# Patient Record
Sex: Female | Born: 1960 | Race: Black or African American | Hispanic: No | Smoking: Former smoker
Health system: Southern US, Community
[De-identification: ages and names within clinical notes are randomized; demographics above are authoritative.]

## PROBLEM LIST (undated history)

## (undated) DIAGNOSIS — T7840XA Allergy, unspecified, initial encounter: Secondary | ICD-10-CM

## (undated) DIAGNOSIS — K635 Polyp of colon: Secondary | ICD-10-CM

## (undated) HISTORY — PX: MANDIBLE SURGERY: SHX707

## (undated) HISTORY — DX: Allergy, unspecified, initial encounter: T78.40XA

## (undated) HISTORY — DX: Polyp of colon: K63.5

---

## 2007-04-07 ENCOUNTER — Encounter: Admission: RE | Admit: 2007-04-07 | Discharge: 2007-04-07 | Payer: Self-pay | Admitting: Family Medicine

## 2007-04-18 ENCOUNTER — Encounter: Admission: RE | Admit: 2007-04-18 | Discharge: 2007-04-18 | Payer: Self-pay | Admitting: Family Medicine

## 2008-04-19 ENCOUNTER — Encounter: Admission: RE | Admit: 2008-04-19 | Discharge: 2008-04-19 | Payer: Self-pay | Admitting: Internal Medicine

## 2009-11-29 ENCOUNTER — Encounter: Admission: RE | Admit: 2009-11-29 | Discharge: 2009-11-29 | Payer: Self-pay | Admitting: Internal Medicine

## 2010-06-08 DIAGNOSIS — K635 Polyp of colon: Secondary | ICD-10-CM

## 2010-06-08 HISTORY — DX: Polyp of colon: K63.5

## 2010-06-29 ENCOUNTER — Encounter: Payer: Self-pay | Admitting: Family Medicine

## 2010-12-26 ENCOUNTER — Other Ambulatory Visit: Payer: Self-pay | Admitting: Internal Medicine

## 2010-12-26 DIAGNOSIS — Z1231 Encounter for screening mammogram for malignant neoplasm of breast: Secondary | ICD-10-CM

## 2011-01-01 ENCOUNTER — Ambulatory Visit
Admission: RE | Admit: 2011-01-01 | Discharge: 2011-01-01 | Disposition: A | Discharge status: Home / Self Care | Payer: Managed Care, Other (non HMO) | Source: Ambulatory Visit | Attending: Internal Medicine | Admitting: Internal Medicine

## 2011-01-01 DIAGNOSIS — Z1231 Encounter for screening mammogram for malignant neoplasm of breast: Secondary | ICD-10-CM

## 2011-02-19 ENCOUNTER — Other Ambulatory Visit (HOSPITAL_COMMUNITY): Payer: Self-pay | Admitting: Gastroenterology

## 2011-02-19 DIAGNOSIS — R112 Nausea with vomiting, unspecified: Secondary | ICD-10-CM

## 2011-03-13 ENCOUNTER — Ambulatory Visit (HOSPITAL_COMMUNITY)
Admission: RE | Admit: 2011-03-13 | Discharge: 2011-03-13 | Disposition: A | Discharge status: Home / Self Care | Payer: Managed Care, Other (non HMO) | Source: Ambulatory Visit | Attending: Gastroenterology | Admitting: Gastroenterology

## 2011-03-13 ENCOUNTER — Encounter (HOSPITAL_COMMUNITY)
Admission: RE | Admit: 2011-03-13 | Discharge: 2011-03-13 | Disposition: A | Discharge status: Home / Self Care | Payer: Managed Care, Other (non HMO) | Source: Ambulatory Visit | Attending: Gastroenterology | Admitting: Gastroenterology

## 2011-03-13 DIAGNOSIS — R079 Chest pain, unspecified: Secondary | ICD-10-CM | POA: Insufficient documentation

## 2011-03-13 DIAGNOSIS — R112 Nausea with vomiting, unspecified: Secondary | ICD-10-CM | POA: Insufficient documentation

## 2011-03-13 DIAGNOSIS — R109 Unspecified abdominal pain: Secondary | ICD-10-CM | POA: Insufficient documentation

## 2011-03-13 MED ORDER — SINCALIDE 5 MCG IJ SOLR: 0.0200 ug/kg | Freq: Once | INTRAMUSCULAR | Status: DC

## 2011-03-13 MED ORDER — TECHNETIUM TC 99M MEBROFENIN IV KIT
5.4000 | PACK | Freq: Once | INTRAVENOUS | Status: AC | PRN
  Administered 2011-03-13: 5 via INTRAVENOUS

## 2011-04-24 HISTORY — PX: COLONOSCOPY W/ POLYPECTOMY: SHX1380

## 2011-06-03 ENCOUNTER — Ambulatory Visit (INDEPENDENT_AMBULATORY_CARE_PROVIDER_SITE_OTHER): Payer: Managed Care, Other (non HMO)

## 2011-06-03 DIAGNOSIS — Z23 Encounter for immunization: Secondary | ICD-10-CM

## 2011-06-03 DIAGNOSIS — J309 Allergic rhinitis, unspecified: Secondary | ICD-10-CM

## 2011-06-15 ENCOUNTER — Encounter: Payer: Self-pay | Admitting: Internal Medicine

## 2011-06-15 DIAGNOSIS — J309 Allergic rhinitis, unspecified: Secondary | ICD-10-CM | POA: Insufficient documentation

## 2011-12-16 ENCOUNTER — Other Ambulatory Visit: Payer: Self-pay | Admitting: Internal Medicine

## 2011-12-16 ENCOUNTER — Ambulatory Visit (INDEPENDENT_AMBULATORY_CARE_PROVIDER_SITE_OTHER): Payer: 59 | Admitting: Family Medicine

## 2011-12-16 VITALS — BP 122/66 | HR 88 | Temp 98.3°F | Resp 16 | Ht 63.0 in | Wt 234.2 lb

## 2011-12-16 DIAGNOSIS — M25579 Pain in unspecified ankle and joints of unspecified foot: Secondary | ICD-10-CM

## 2011-12-16 NOTE — Patient Instructions (Addendum)
You have 2 issues going on:  1.  Ankle pain: - You should wear an ankle brace when you are running. - You should also do 30 ankle exercises (toe raises) on a step or phonebook 3 times a week. - Take 600 mg Ibuprofen (3 tabs) as needed.  2.  Leg weakness: - Use the exercises below.   - These will strengthen the smaller muscles as well as the larger ones you are currently working out    = STRENGTHENING EXERCISES - Piriformis Syndrome  These are some of the caregiver again or until your symptoms are resolved. Remember:   Strong muscles with good endurance tolerate stress better.   Do the exercises as initially prescribed by your caregiver. Progress slowly with each exercise, gradually increasing the number of repetitions and weight used under their guidance.  STRENGTH - Hip Abductors, Straight Leg Raises Be aware of your form throughout the entire exercise so that you exercise the correct muscles. Sloppy form means that you are not strengthening the correct muscles.  Lie on your side so that your head, shoulders, knee and hip line up. You may bend your lower knee to help maintain your balance. Your right / left leg should be on top.   Roll your hips slightly forward, so that your hips are stacked directly over each other and your right / left knee is facing forward.   Lift your top leg up 4-6 inches, leading with your heel. Be sure that your foot does not drift forward or that your knee does not roll toward the ceiling.   Hold this position for __________ seconds. You should feel the muscles in your outer hip lifting (you may not notice this until your leg begins to tire).   Slowly lower your leg to the starting position. Allow the muscles to fully relax before beginning the next repetition.  Repeat __________ times. Complete this exercise __________ times per day.  STRENGTH - Hip Abductors, Quadriped  On a firm, lightly padded surface, position yourself on your hands and knees. Your  hands should be directly below your shoulders and your knees should be directly below your hips.   Keeping your right / left knee bent, lift your leg out to the side. Keep your legs level and in line with your shoulders.   Position yourself on your hands and knees.   Hold for __________ seconds.   Keeping your trunk steady and your hips level, slowly lower your leg to the starting position.  Repeat __________ times. Complete this exercise __________ times per day.  STRENGTH - Hip Abductors, Standing  Tie one end of a rubber exercise band/tubing to a secure surface (table, pole) and tie a loop at the other end.   Place the loop around your right / left ankle. Keeping your ankle with the band directly opposite of the secured end, step away until there is tension in the tube/band.   Hold onto a chair as needed for balance.   Keeping your back upright, your shoulders over your hips, and your toes pointing forward, lift your right / left leg out to your side. Be sure to lift your leg with your hip muscles. Do not "throw" your leg or tip your body to lift your leg.   Slowly and with control, return to the starting position.  Repeat exercise __________ times. Complete this exercise __________ times per day.  Document Released: 05/25/2005 Document Revised: 05/14/2011 Document Reviewed: 09/06/2008 [facility] Patient Information 2012 [place], [place].

## 2011-12-16 NOTE — Progress Notes (Signed)
Patient ID: Kellie Santiago, female   DOB: 07-Dec-1960, 51 y.o.   MRN: [id number] Kellie Santiago is a 52 y.o. female who presents to Urgent Care today for Left ankle pain, chronic in nautre:  1.  Left ankle pain:  Present since she had bad ankle sprain about 5 years ago.  Dull aching, intermittent swelling in Left ankle.  Has also noticed some decreased strength in Left leg.  Has started working out to lose weight and wants to ensure she won't injure it worse.    Has occasional pain when she tries running.  Has brace at home but doesn't wear it.  She is able to do squats and other exercises without pain.  Has tried Ibuprofen with occasional relief.     PMH reviewed.  ROS as above otherwise neg.  No chest pain, palpitations, SOB, Fever, Chills, Abd pain, N/V/D.  Medications reviewed. Current Outpatient Prescriptions  Medication Sig Dispense Refill  . fluticasone (FLONASE) 50 MCG/ACT nasal spray Place 2 sprays into the nose daily.        Marland Kitchen levocetirizine (XYZAL) 5 MG tablet Take 5 mg by mouth every evening.          Exam:  BP 122/66  Pulse 88  Temp 98.3 F (36.8 C) (Oral)  Resp 16  Ht 5\' 3"  (1.6 m)  Wt 234 lb 3.2 oz (106.232 kg)  BMI 41.49 kg/m2  SpO2 98%  LMP 11/23/2011 Gen: Well NAD MSK:  BL ankle joints with laxity noted.  Otherwise Right ankle WNL Left ankle:  No pain or tenderness with any manipulation of joint. Negative drawer test.  No laxity with eversion or inversion of ankle.  No syndesmotic pain.  Left hip flexor, hamstring strength 3/5.  Other LE strength 5/5 Exts: Trace edema BL ankles noted.    Assessment and Plan:  1.  Ankle pain:  Strengthening exercises, brace, NSAIDs.  See instructions.  No acute injury   2. Left leg weakness:  Provided exercises to help with gluteus medius strengthening.

## 2011-12-24 ENCOUNTER — Other Ambulatory Visit: Payer: Self-pay | Admitting: Family Medicine

## 2012-01-14 ENCOUNTER — Other Ambulatory Visit: Payer: Self-pay | Admitting: Internal Medicine

## 2012-01-14 DIAGNOSIS — Z1231 Encounter for screening mammogram for malignant neoplasm of breast: Secondary | ICD-10-CM

## 2012-01-25 ENCOUNTER — Ambulatory Visit (INDEPENDENT_AMBULATORY_CARE_PROVIDER_SITE_OTHER): Payer: 59 | Admitting: Physician Assistant

## 2012-01-25 VITALS — BP 136/79 | HR 68 | Temp 98.3°F | Resp 16 | Ht 63.18 in | Wt 232.6 lb

## 2012-01-25 DIAGNOSIS — Z0000 Encounter for general adult medical examination without abnormal findings: Secondary | ICD-10-CM

## 2012-01-25 LAB — POCT CBC
Granulocyte percent: 60.2 %G (ref 37–80)
HCT, POC: 42 % (ref 37.7–47.9)
Hemoglobin: 13.2 g/dL (ref 12.2–16.2)
Lymph, poc: 3.1 (ref 0.6–3.4)
MCH, POC: 26 pg — AB (ref 27–31.2)
MCHC: 31.4 g/dL — AB (ref 31.8–35.4)
MCV: 82.7 fL (ref 80–97)
MID (cbc): 0.6 (ref 0–0.9)
MPV: 9.3 fL (ref 0–99.8)
POC Granulocyte: 5.7 (ref 2–6.9)
POC LYMPH PERCENT: 33 %L (ref 10–50)
POC MID %: 6.8 %M (ref 0–12)
Platelet Count, POC: 314 10*3/uL (ref 142–424)
RBC: 5.08 M/uL (ref 4.04–5.48)
RDW, POC: 13.9 %
WBC: 9.5 10*3/uL (ref 4.6–10.2)

## 2012-01-25 LAB — POCT UA - MICROSCOPIC ONLY
Casts, Ur, LPF, POC: NEGATIVE
Crystals, Ur, HPF, POC: NEGATIVE
Mucus, UA: NEGATIVE
Yeast, UA: NEGATIVE

## 2012-01-25 LAB — POCT URINALYSIS DIPSTICK
Bilirubin, UA: NEGATIVE
Glucose, UA: NEGATIVE
Ketones, UA: NEGATIVE
Leukocytes, UA: NEGATIVE
Nitrite, UA: NEGATIVE
Protein, UA: NEGATIVE
Spec Grav, UA: 1.02
Urobilinogen, UA: 0.2
pH, UA: 5.5

## 2012-01-25 MED ORDER — NORETHIN-ETH ESTRAD TRIPHASIC 0.5/0.75/1-35 MG-MCG PO TABS: 1.0000 | ORAL_TABLET | Freq: Every day | ORAL | Status: DC

## 2012-01-25 NOTE — Progress Notes (Signed)
  Subjective:    Patient ID: Kellie Santiago, female    DOB: Nov 26, 1960, 51 y.o.   MRN: [id number]  HPI 51 year old female presents for complete physical.  Last physical August 2012 that included a pap test which was normal.  She has no specific complaints today and says she is doing well. Has started exercising and working on weight loss. She is doing weights as well as aerobic exercise 3-4 times/week.  She has lost 2 pounds since last OV in July.  Continues to take OCP's. Periods regular while on them. She tried to discontinue last year but had irregular periods so she re-started.  No history of hypertension, diabetes, or hyperlipidemia. Patient is a nonsmoker except a cigar on her birthday.     Review of Systems  All other systems reviewed and are negative.       Objective:   Physical Exam  Constitutional: She is oriented to person, place, and time. She appears well-developed and well-nourished.  HENT:  Head: Normocephalic and atraumatic.  Right Ear: Hearing, tympanic membrane, external ear and ear canal normal.  Left Ear: Hearing, tympanic membrane, external ear and ear canal normal.  Mouth/Throat: Uvula is midline, oropharynx is clear and moist and mucous membranes are normal. No oropharyngeal exudate.  Eyes: Conjunctivae and EOM are normal. Pupils are equal, round, and reactive to light.  Neck: Normal range of motion. Neck supple. No thyromegaly present.  Cardiovascular: Normal rate, regular rhythm and normal heart sounds.   Pulmonary/Chest: Effort normal and breath sounds normal.  Abdominal: Soft. Bowel sounds are normal. There is no tenderness.  Genitourinary: No breast swelling, tenderness, discharge or bleeding.  Lymphadenopathy:    She has no cervical adenopathy.  Neurological: She is alert and oriented to person, place, and time.  Psychiatric: She has a normal mood and affect. Her behavior is normal. Judgment and thought content normal.          Assessment & Plan:   1.  Routine general medical examination at a health care facility  POCT CBC, POCT urinalysis dipstick, TSH, Lipid panel, Comprehensive metabolic panel, POCT UA - Microscopic Only  Patient has mammogram scheduled for 01/29/12.  Had colonoscopy November 2012 - repeat in 5 years.  Recommend next screening pap in 2015.  Follow up as needed.

## 2012-01-26 LAB — LIPID PANEL
Cholesterol: 228 mg/dL — ABNORMAL HIGH (ref 0–200)
HDL: 62 mg/dL (ref >39)
LDL Cholesterol: 121 mg/dL — ABNORMAL HIGH (ref 0–99)
Total CHOL/HDL Ratio: 3.7 Ratio
Triglycerides: 227 mg/dL — ABNORMAL HIGH (ref <150)
VLDL: 45 mg/dL — ABNORMAL HIGH (ref 0–40)

## 2012-01-26 LAB — COMPREHENSIVE METABOLIC PANEL
ALT: 44 U/L — ABNORMAL HIGH (ref 0–35)
AST: 41 U/L — ABNORMAL HIGH (ref 0–37)
Albumin: 4.5 g/dL (ref 3.5–5.2)
Alkaline Phosphatase: 65 U/L (ref 39–117)
BUN: 15 mg/dL (ref 6–23)
CO2: 27 mEq/L (ref 19–32)
Calcium: 9.8 mg/dL (ref 8.4–10.5)
Chloride: 101 mEq/L (ref 96–112)
Creat: 0.92 mg/dL (ref 0.50–1.10)
Glucose, Bld: 87 mg/dL (ref 70–99)
Potassium: 3.9 mEq/L (ref 3.5–5.3)
Sodium: 138 mEq/L (ref 135–145)
Total Bilirubin: 0.4 mg/dL (ref 0.3–1.2)
Total Protein: 7.6 g/dL (ref 6.0–8.3)

## 2012-01-26 LAB — TSH: TSH: 1.889 u[IU]/mL (ref 0.350–4.500)

## 2012-01-29 ENCOUNTER — Ambulatory Visit
Admission: RE | Admit: 2012-01-29 | Discharge: 2012-01-29 | Disposition: A | Discharge status: Home / Self Care | Payer: 59 | Source: Ambulatory Visit | Attending: Internal Medicine | Admitting: Internal Medicine

## 2012-01-29 DIAGNOSIS — Z1231 Encounter for screening mammogram for malignant neoplasm of breast: Secondary | ICD-10-CM

## 2012-03-14 ENCOUNTER — Ambulatory Visit (INDEPENDENT_AMBULATORY_CARE_PROVIDER_SITE_OTHER): Payer: 59 | Admitting: Physician Assistant

## 2012-03-14 VITALS — BP 123/76 | HR 78 | Temp 97.9°F | Resp 18 | Ht 63.0 in | Wt 231.6 lb

## 2012-03-14 DIAGNOSIS — Z23 Encounter for immunization: Secondary | ICD-10-CM

## 2012-03-14 NOTE — Progress Notes (Signed)
Tdap only.  Vaccine authorized.

## 2012-03-14 NOTE — Progress Notes (Signed)
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  She is concerned about being a carrier of pertussis and not even know and did not want to pass it to the child

## 2012-07-04 ENCOUNTER — Ambulatory Visit (INDEPENDENT_AMBULATORY_CARE_PROVIDER_SITE_OTHER): Payer: 59 | Admitting: Family Medicine

## 2012-07-04 ENCOUNTER — Ambulatory Visit
Admission: RE | Admit: 2012-07-04 | Discharge: 2012-07-04 | Disposition: A | Discharge status: Home / Self Care | Payer: 59 | Source: Ambulatory Visit | Attending: Family Medicine | Admitting: Family Medicine

## 2012-07-04 VITALS — BP 126/84 | HR 83 | Temp 98.6°F | Resp 16 | Ht 63.0 in | Wt 228.0 lb

## 2012-07-04 DIAGNOSIS — R51 Headache: Secondary | ICD-10-CM

## 2012-07-04 DIAGNOSIS — S060X9A Concussion with loss of consciousness of unspecified duration, initial encounter: Secondary | ICD-10-CM

## 2012-07-04 DIAGNOSIS — R110 Nausea: Secondary | ICD-10-CM

## 2012-07-04 LAB — POCT URINE PREGNANCY: Preg Test, Ur: NEGATIVE

## 2012-07-04 MED ORDER — TRAMADOL HCL 50 MG PO TABS: 50.0000 mg | ORAL_TABLET | Freq: Three times a day (TID) | ORAL | Status: DC | PRN

## 2012-07-04 NOTE — Patient Instructions (Addendum)
You most likely have a concussion. Assuming that your CT scan is negative, we need you to rest and allow your brain to heal.  Let's plan to recheck on Wednesday.  Use the tramadol as needed for pain- you can also try ibuprofen/ tylenol.    Concussion and Brain Injury A blow or jolt to the head can disrupt the normal function of the brain. This type of brain injury is often called a "concussion" or a "closed head injury." Concussions are usually not life-threatening. Even so, the effects of a concussion can be serious.  CAUSES  A concussion is caused by a blunt blow to the head. The blow might be direct or indirect as described below.  Direct blow (running into another player during a soccer game, being hit in a fight, or hitting your head on a hard surface).  Indirect blow (when your head moves rapidly and violently back and forth like in a car crash). SYMPTOMS  The brain is very complex. Every head injury is different. Some symptoms may appear right away. Other symptoms may not show up for days or weeks after the concussion. The signs of concussion can be hard to notice. Early on, problems may be missed by patients, family members, and caregivers. You may look fine even though you are acting or feeling differently.  These symptoms are usually temporary, but may last for days, weeks, or even longer. Symptoms include:  Mild headaches that will not go away.  Having more trouble than usual with:  Remembering things.  Paying attention or concentrating.  Organizing daily tasks.  Making decisions and solving problems.  Slowness in thinking, acting, speaking, or reading.  Getting lost or easily confused.  Feeling tired all the time or lacking energy (fatigue).  Feeling drowsy.  Sleep disturbances.  Sleeping more than usual.  Sleeping less than usual.  Trouble falling asleep.  Trouble sleeping (insomnia).  Loss of balance or feeling lightheaded or dizzy.  Nausea or  vomiting.  Numbness or tingling.  Increased sensitivity to:  Sounds.  Lights.  Distractions. Other symptoms might include:  Vision problems or eyes that tire easily.  Diminished sense of taste or smell.  Ringing in the ears.  Mood changes such as feeling sad, anxious, or listless.  Becoming easily irritated or angry for little or no reason.  Lack of motivation. DIAGNOSIS  Your caregiver can usually diagnose a concussion or mild brain injury based on your description of your injury and your symptoms.  Your evaluation might include:  A brain scan to look for signs of injury to the brain. Even if the test shows no injury, you may still have a concussion.  Blood tests to be sure other problems are not present. TREATMENT   People with a concussion need to be examined and evaluated. Most people with concussions are treated in an emergency department, urgent care, or clinic. Some people must stay in the hospital overnight for further treatment.  Your caregiver will send you home with important instructions to follow. Be sure to carefully follow them.  Tell your caregiver if you are already taking any medicines (prescription, over-the-counter, or natural remedies), or if you are drinking alcohol or taking illegal drugs. Also, talk with your caregiver if you are taking blood thinners (anticoagulants) or aspirin. These drugs may increase your chances of complications. All of this is important information that may affect treatment.  Only take over-the-counter or prescription medicines for pain, discomfort, or fever as directed by your caregiver. PROGNOSIS  How fast people recover from brain injury varies from person to person. Although most people have a good recovery, how quickly they improve depends on many factors. These factors include how severe their concussion was, what part of the brain was injured, their age, and how healthy they were before the concussion.  Because all head  injuries are different, so is recovery. Most people with mild injuries recover fully. Recovery can take time. In general, recovery is slower in older persons. Also, persons who have had a concussion in the past or have other medical problems may find that it takes longer to recover from their current injury. Anxiety and depression may also make it harder to adjust to the symptoms of brain injury. HOME CARE INSTRUCTIONS  Return to your normal activities slowly, not all at once. You must give your body and brain enough time for recovery.  Get plenty of sleep at night, and rest during the day. Rest helps the brain to heal.  Avoid staying up late at night.  Keep the same bedtime hours on weekends and weekdays.  Take daytime naps or rest breaks when you feel tired.  Limit activities that require a lot of thought or concentration (brain or cognitive rest). This includes:  Homework or job-related work.  Watching TV.  Computer work.  Avoid activities that could lead to a second brain injury, such as contact or recreational sports, until your caregiver says it is okay. Even after your brain injury has healed, you should protect yourself from having another concussion.  Ask your caregiver when you can return to your normal activities such as driving, bicycling, or operating heavy equipment. Your ability to react may be slower after a brain injury.  Talk with your caregiver about when you can return to work or school.  Inform your teachers, school nurse, school counselor, coach, Event organiser, or work Production designer, theatre/television/film about your injury, symptoms, and restrictions. They should be instructed to report:  Increased problems with attention or concentration.  Increased problems remembering or learning new information.  Increased time needed to complete tasks or assignments.  Increased irritability or decreased ability to cope with stress.  Increased symptoms.  Take only those medicines that your  caregiver has approved.  Do not drink alcohol until your caregiver says you are well enough to do so. Alcohol and certain other drugs may slow your recovery and can put you at risk of further injury.  If it is harder than usual to remember things, write them down.  If you are easily distracted, try to do one thing at a time. For example, do not try to watch TV while fixing dinner.  Talk with family members or close friends when making important decisions.  Keep all follow-up appointments. Repeated evaluation of your symptoms is recommended for your recovery. PREVENTION  Protect your head from future injury. It is very important to avoid another head or brain injury before you have recovered. In rare cases, another injury has lead to permanent brain damage, brain swelling, or death. Avoid injuries by using:  Seatbelts when riding in a car.  Alcohol only in moderation.  A helmet when biking, skiing, skateboarding, skating, or doing similar activities.  Safety measures in your home.  Remove clutter and tripping hazards from floors and stairways.  Use grab bars in bathrooms and handrails by stairs.  Place non-slip mats on floors and in bathtubs.  Improve lighting in dim areas. SEEK MEDICAL CARE IF:  A head injury can cause lingering symptoms.  You should seek medical care if you have any of the following symptoms for more than 3 weeks after your injury or are planning to return to sports:  Chronic headaches.  Dizziness or balance problems.  Nausea.  Vision problems.  Increased sensitivity to noise or light.  Depression or mood swings.  Anxiety or irritability.  Memory problems.  Difficulty concentrating or paying attention.  Sleep problems.  Feeling tired all the time. SEEK IMMEDIATE MEDICAL CARE IF:  You have had a blow or jolt to the head and you (or your family or friends) notice:  Severe or worsening headaches.  Weakness (even if only in one hand or one leg or  one part of the face), numbness, or decreased coordination.  Repeated vomiting.  Increased sleepiness or passing out.  One black center of the eye (pupil) is larger than the other.  Convulsions (seizures).  Slurred speech.  Increasing confusion, restlessness, agitation, or irritability.  Lack of ability to recognize people or places.  Neck pain.  Difficulty being awakened.  Unusual behavior changes.  Loss of consciousness. Older adults with a brain injury may have a higher risk of serious complications such as a blood clot on the brain. Headaches that get worse or an increase in confusion are signs of this complication. If these signs occur, see a caregiver right away. MAKE SURE YOU:   Understand these instructions.  Will watch your condition.  Will get help right away if you are not doing well or get worse. FOR MORE INFORMATION  Several groups help people with brain injury and their families. They provide information and put people in touch with local resources. These include support groups, rehabilitation services, and a variety of health care professionals. Among these groups, the Brain Injury Association (BIA, www.biausa.org) has a Secretary/administrator that gathers scientific and educational information and works on a national level to help people with brain injury.  Document Released: 08/15/2003 Document Revised: 08/17/2011 Document Reviewed: 01/11/2008 [facility] Patient Information 2013 [place], [place].

## 2012-07-04 NOTE — Progress Notes (Signed)
[name] [address] [place] [address] [phone]  Date:  07/04/2012   Name:  Kellie Santiago   DOB:  06-Jun-1961   MRN:  [id number]  PCP:  No primary provider on file.    Chief Complaint: Headache, Sinusitis and Dizziness   History of Present Illness:  Kellie Santiago is a 52 y.o. very pleasant female patient who presents with the following:  She is here because "my head hurts."  Yesterday she stood up from a crouch- hit her head on the corner of a sink.  She did have a little bit of bleeding from where she hit her head.  She really did not have much pain at the time, no LOC- she did not think she had significantly injured herself.  She felt ok until this am- when she woke up this am she noted that her head hurt, and she felt nauseated and lightheaded. No vomiting.  No changes in her vision or hearing She does have a scratchy throat.   No sneezing, no runny nose.   Mild cough which she attributes to allergies, but no fever.    She is generally healthy, and is working on her health through yoga and a healthy diet.   She is SA, LMP was about one month ago, she is on OCP  Patient Active Problem List  Diagnosis  . AR (allergic rhinitis)  . Obesity, Class III, BMI 40-49.9 (morbid obesity)    Past Medical History  Diagnosis Date  . Allergy     Past Surgical History  Procedure Date  . Cesarean section     X 2  . Mandible surgery     History  Substance Use Topics  . Smoking status: Current Some Day Smoker    Types: Cigars  . Smokeless tobacco: Not on file  . Alcohol Use: No     Comment: SOCIAL    Family History  Problem Relation Age of Onset  . Hypertension Mother   . Diabetes Father   . Hypertension Brother   . Hypertension Maternal Grandmother   . Diabetes Maternal Grandmother     Allergies  Allergen Reactions  . Penicillins Rash    Medication list has been reviewed and updated.  Current Outpatient Prescriptions on File Prior to  Visit  Medication Sig Dispense Refill  . fluticasone (FLONASE) 50 MCG/ACT nasal spray Place 2 sprays into the nose daily.        Marland Kitchen levocetirizine (XYZAL) 5 MG tablet Take 5 mg by mouth every evening.        . norethindrone-ethinyl estradiol (ORTHO-NOVUM 7/7/7, 28,) 0.5/0.75/1-35 MG-MCG tablet Take 1 tablet by mouth daily.  28 tablet  11    Review of Systems:  As per HPI- otherwise negative.   Physical Examination: Filed Vitals:   07/04/12 1413  BP: 126/84  Pulse: 83  Temp: 98.6 F (37 C)  Resp: 16   Filed Vitals:   07/04/12 1413  Height: 5\' 3"  (1.6 m)  Weight: 228 lb (103.42 kg)   Body mass index is 40.39 kg/(m^2). Ideal Body Weight: Weight in (lb) to have BMI = 25: 140.8   GEN: WDWN, NAD, Non-toxic, A & O x 3 HEENT: Atraumatic, Normocephalic. Neck supple. No masses, No LAD. Bilateral TM wnl, oropharynx normal.  PEERL,EOMI.   Cervical spine is negative, no bony tenderness and full ROM Crown of her head reveals a small abraded area, and she has tenderness of the scalp in this area.  No hematoma  or step- off Ears and Nose: No external deformity. CV: RRR, No M/G/R. No JVD. No thrill. No extra heart sounds. PULM: CTA B, no wheezes, crackles, rhonchi. No retractions. No resp. distress. No accessory muscle use. ABD: S, NT, ND. No rebound. No HSM. EXTR: No c/c/e NEURO Normal gait. Normal strength, sensation and DTR all extremities.  She wobbles a bit on Romberg but does not step.   PSYCH: Normally interactive. Conversant. Not depressed or anxious appearing.  Calm demeanor.   Results for orders placed in visit on 07/04/12  POCT URINE PREGNANCY      Component Value Range   Preg Test, Ur Negative     Assessment and Plan: 1. Concussion  CT Head Wo Contrast  2. Nausea  POCT urine pregnancy  3. Headache  traMADol (ULTRAM) 50 MG tablet   Discussed RBA of a CT scan.  She would like to go ahead and do a CT today. Assuming negative result will treat as for concussion with strict  rest and tramadol as needed for pain (if ibuprofen and/or tylenol not adequate).  Cautioned her that even if CT negative if her symptoms get worse she should go to the ED.    Abbe Amsterdam, MD  *RADIOLOGY REPORT*  Clinical Data: Head trauma yesterday with headache and nausea.  CT HEAD WITHOUT CONTRAST  Technique: Contiguous axial images were obtained from the base of the skull through the vertex without contrast.  Comparison: None.  Findings: The brain has a normal appearance without evidence of malformation, atrophy, old or acute infarction, mass lesion, hemorrhage, hydrocephalus or extra-axial collection. No skull fracture. No fluid in the sinuses, middle ears or mastoids.  IMPRESSION: Normal head CT. No post-traumatic finding.  Called and let her know- negative head CT.  She will rest and treat as for concussion.   Recheck here in 48 hours.   If any worsening or new symtpoms seek care right away

## 2012-07-06 ENCOUNTER — Ambulatory Visit (INDEPENDENT_AMBULATORY_CARE_PROVIDER_SITE_OTHER): Payer: 59 | Admitting: Family Medicine

## 2012-07-06 VITALS — BP 117/75 | HR 67 | Temp 98.2°F | Resp 18 | Ht 63.0 in | Wt 228.4 lb

## 2012-07-06 DIAGNOSIS — S060X9A Concussion with loss of consciousness of unspecified duration, initial encounter: Secondary | ICD-10-CM

## 2012-07-06 NOTE — Patient Instructions (Addendum)
Over the next couple of days you may gradually increase your activity level.  If you are able to tolerate being more active you may return to work on Monday.   However, if any any point you feel worse with activity, that means you are doing too much and need to back off.  Let us know if returning to work on Monday does not seem like a good idea.

## 2012-07-06 NOTE — Progress Notes (Signed)
[facility] [address] [place] [address] [phone]  Date:  07/06/2012   Name:  Kellie Santiago   DOB:  11-21-1960   MRN:  [id number]  PCP:  No primary provider on file.    Chief Complaint: Follow-up   History of Present Illness:  Kellie Santiago is a 52 y.o. very pleasant female patient who presents with the following:  Here today to recheck concussion- see OV 07/04/12. She had a CT of her head (negative result) performed due to hitting her head and resulting HA.  She is not well, but she does feel better.  She tried looking at email and did ok.  No vomiting.   She is using tramadol for her HA- ibuprofen did not help much  Patient Active Problem List  Diagnosis  . AR (allergic rhinitis)  . Obesity, Class III, BMI 40-49.9 (morbid obesity)    Past Medical History  Diagnosis Date  . Allergy     Past Surgical History  Procedure Date  . Cesarean section     X 2  . Mandible surgery     History  Substance Use Topics  . Smoking status: Current Some Day Smoker    Types: Cigars  . Smokeless tobacco: Not on file  . Alcohol Use: No     Comment: SOCIAL    Family History  Problem Relation Age of Onset  . Hypertension Mother   . Diabetes Father   . Hypertension Brother   . Hypertension Maternal Grandmother   . Diabetes Maternal Grandmother     Allergies  Allergen Reactions  . Penicillins Rash    Medication list has been reviewed and updated.  Current Outpatient Prescriptions on File Prior to Visit  Medication Sig Dispense Refill  . fluticasone (FLONASE) 50 MCG/ACT nasal spray Place 2 sprays into the nose daily.        Marland Kitchen levocetirizine (XYZAL) 5 MG tablet Take 5 mg by mouth every evening.        . norethindrone-ethinyl estradiol (ORTHO-NOVUM 7/7/7, 28,) 0.5/0.75/1-35 MG-MCG tablet Take 1 tablet by mouth daily.  28 tablet  11  . traMADol (ULTRAM) 50 MG tablet Take 1 tablet (50 mg total) by mouth every 8 (eight) hours as needed for pain.  30  tablet  0    Review of Systems:  As per HPI- otherwise negative.   Physical Examination: Filed Vitals:   07/06/12 1354  BP: 117/75  Pulse: 67  Temp: 98.2 F (36.8 C)  Resp: 18   Filed Vitals:   07/06/12 1354  Height: 5\' 3"  (1.6 m)  Weight: 228 lb 6.4 oz (103.602 kg)   Body mass index is 40.46 kg/(m^2). Ideal Body Weight: Weight in (lb) to have BMI = 25: 140.8   GEN: WDWN, NAD, Non-toxic, A & O x 3, obese HEENT: Atraumatic, Normocephalic. Neck supple. No masses, No LAD. Bilateral TM wnl, oropharynx normal.  PEERL,EOMI.   Ears and Nose: No external deformity. CV: RRR, No M/G/R. No JVD. No thrill. No extra heart sounds. PULM: CTA B, no wheezes, crackles, rhonchi. No retractions. No resp. distress. No accessory muscle use. ABD: S, NT, ND, +BS. No rebound. No HSM. EXTR: No c/c/e NEURO Normal gait. Normal strength and sensation all extremities, normal Romberg PSYCH: Normally interactive. Conversant. Not depressed or anxious appearing.  Calm demeanor.    Assessment and Plan: 1. Concussion    Symptoms are improved.  She will try to slowly increase her activity over the next few days.  Depending on how she does she may try to RTW on Monday.  If any any point she has severe HA, vomiting or other problems she is to seek care.  If not ready to RTW on Monday just let us know  Cillian Gwinner, MD

## 2012-07-13 ENCOUNTER — Telehealth: Payer: Self-pay

## 2012-07-13 NOTE — Telephone Encounter (Signed)
Patient states she is improving and has not taken any ibuprofen today. She wants to know if she can try to drive, I have advised her this is fine and to let us know if she has trouble with this. FYI

## 2012-07-13 NOTE — Telephone Encounter (Signed)
Pt states that she would like to speak with Dr.Copland about how she is doing. Best# [phone]

## 2012-12-19 ENCOUNTER — Other Ambulatory Visit: Payer: Self-pay | Admitting: Physician Assistant

## 2012-12-19 NOTE — Telephone Encounter (Signed)
Patient should have 2 packs remaining on Rx from 01/2012 sent these in

## 2013-02-04 ENCOUNTER — Ambulatory Visit (INDEPENDENT_AMBULATORY_CARE_PROVIDER_SITE_OTHER): Payer: 59 | Admitting: Family Medicine

## 2013-02-04 VITALS — BP 124/78 | HR 82 | Temp 98.7°F | Resp 18 | Ht 63.0 in | Wt 232.0 lb

## 2013-02-04 DIAGNOSIS — Z01419 Encounter for gynecological examination (general) (routine) without abnormal findings: Secondary | ICD-10-CM

## 2013-02-04 DIAGNOSIS — Z0000 Encounter for general adult medical examination without abnormal findings: Secondary | ICD-10-CM

## 2013-02-04 DIAGNOSIS — Z23 Encounter for immunization: Secondary | ICD-10-CM

## 2013-02-04 LAB — POCT URINALYSIS DIPSTICK
Bilirubin, UA: NEGATIVE
Blood, UA: NEGATIVE
Ketones, UA: NEGATIVE
Leukocytes, UA: NEGATIVE
Protein, UA: NEGATIVE
Spec Grav, UA: 1.01
Urobilinogen, UA: 0.2
pH, UA: 7

## 2013-02-04 LAB — CBC WITH DIFFERENTIAL/PLATELET
Basophils Absolute: 0 10*3/uL (ref 0.0–0.1)
Basophils Relative: 1 % (ref 0–1)
Eosinophils Relative: 2 % (ref 0–5)
Hemoglobin: 13.4 g/dL (ref 12.0–15.0)
Monocytes Absolute: 0.5 10*3/uL (ref 0.1–1.0)
Monocytes Relative: 6 % (ref 3–12)
Neutrophils Relative %: 64 % (ref 43–77)
Platelets: 265 10*3/uL (ref 150–400)
RDW: 15 % (ref 11.5–15.5)

## 2013-02-04 LAB — LIPID PANEL
Cholesterol: 171 mg/dL (ref 0–200)
HDL: 67 mg/dL (ref >39)
LDL Cholesterol: 79 mg/dL (ref 0–99)
Triglycerides: 127 mg/dL (ref <150)

## 2013-02-04 LAB — COMPREHENSIVE METABOLIC PANEL
ALT: 17 U/L (ref 0–35)
AST: 20 U/L (ref 0–37)
Albumin: 4.4 g/dL (ref 3.5–5.2)
CO2: 26 mEq/L (ref 19–32)
Chloride: 103 mEq/L (ref 96–112)
Potassium: 4 mEq/L (ref 3.5–5.3)
Total Protein: 7.4 g/dL (ref 6.0–8.3)

## 2013-02-04 LAB — VITAMIN B12: Vitamin B-12: 428 pg/mL (ref 211–911)

## 2013-02-04 LAB — HEMOGLOBIN A1C: Hgb A1c MFr Bld: 5.3 % (ref <5.7)

## 2013-02-04 MED ORDER — NORETHIN-ETH ESTRAD TRIPHASIC 0.5/0.75/1-35 MG-MCG PO TABS: 1.0000 | ORAL_TABLET | Freq: Every day | ORAL | Status: DC

## 2013-02-04 NOTE — Progress Notes (Signed)
[address]   [place], [place]  [address]   [phone] Subjective:    Patient ID: Kellie Santiago, female    DOB: [birth year], 52 y.o.   MRN: [id number]  HPI This 52 y.o. female presents for evaluation of CPE.  Last physical 2012.  Pap smear 2012.  No previous abnormal pap smears. Mammogram 2013.  [facility] [address]. Colonoscopy age 35; +polyps; repeat 5 years.  [facility] TDAP 2013. Flu vaccines yearly.   Eye exam 2014; +glasses; no g/c.  [facility]. Dental exam every six months.  Allergic Rhinitis:  Allergy shots; Xyzal and Flonase.    Contraception:  Tried stopping contraception at age 30.  Not interested in stopping OCP at this time.  No tobacco abuse.  Regular menses on OCP.   Review of Systems  Constitutional: Negative.   HENT: Negative.   Eyes: Negative.   Respiratory: Negative.   Cardiovascular: Negative.   Endocrine: Negative.   Musculoskeletal: Positive for arthralgias.       Chronic hip pain; +ankle pain chronic from previous sprain.  Skin: Negative for color change, pallor, rash and wound.  Allergic/Immunologic: Negative.   Neurological: Positive for numbness.       +numbness/tingling in R hand at night in 2nd, 3rd fingers; thinks is carpal tunnel; has wrist brace but does not like to wear it.  Chronic hip pain and ankle pain.  Hematological: Negative.     Past Medical History  Diagnosis Date  . Allergy     Past Surgical History  Procedure Laterality Date  . Cesarean section      X 2  . Mandible surgery      Prior to Admission medications   Medication Sig Start Date End Date Taking? Authorizing Provider  fluticasone (FLONASE) 50 MCG/ACT nasal spray Place 2 sprays into the nose daily.     Yes Historical Provider, MD  levocetirizine (XYZAL) 5 MG tablet Take 5 mg by mouth every evening.     Yes Historical Provider, MD  ORTHO-NOVUM 7/7/7, 28, 0.5/0.75/1-35 MG-MCG tablet take 1 tablet by mouth once daily 12/19/12  Yes Nelva Nay, PA-C     Allergies  Allergen Reactions  . Penicillins Rash    History   Social History  . Marital Status: Unknown    Spouse Name: N/A    Number of Children: 2  . Years of Education: N/A   Occupational History  .      Marketing Research   Social History Main Topics  . Smoking status: Former Smoker    Types: Cigars  . Smokeless tobacco: Not on file  . Alcohol Use: Yes     Comment: SOCIAL  . Drug Use: No  . Sexual Activity: Not on file   Other Topics Concern  . Not on file   Social History Narrative   Marital status:  Divorced; dating x 14 years.  Happy.      Children: 2 (23, 28); 1 adopted grandchild.      Lives:  Alone      Employment:  Training and development officer; happy.      Tobacco: none      Alcohol: red wine.      Drugs:  None      Exercise:  Walk/run couch to 5K.      Seatbelt: 100%        Family History  Problem Relation Age of Onset  . Hypertension Mother   . Diabetes Father   . Dementia Father   .  Hypertension Brother   . Hypertension Maternal Grandmother   . Diabetes Maternal Grandmother         Objective:   Physical Exam  Nursing note and vitals reviewed. Constitutional: She is oriented to person, place, and time. She appears well-developed and well-nourished. No distress.  HENT:  Head: Normocephalic and atraumatic.  Right Ear: External ear normal.  Left Ear: External ear normal.  Nose: Nose normal.  Mouth/Throat: Oropharynx is clear and moist.  Eyes: Conjunctivae and EOM are normal. Pupils are equal, round, and reactive to light.  Neck: Normal range of motion. Neck supple. No thyromegaly present.  Cardiovascular: Normal rate, regular rhythm, normal heart sounds and intact distal pulses.  Exam reveals no gallop and no friction rub.   No murmur heard. Pulmonary/Chest: Effort normal and breath sounds normal. She has no wheezes. She has no rales.  Abdominal: Soft. Bowel sounds are normal. She exhibits no distension and no mass. There is no tenderness.  There is no rebound and no guarding.  Genitourinary: Vagina normal. No breast swelling, tenderness, discharge or bleeding. There is no rash, tenderness or lesion on the right labia. There is no rash, tenderness or lesion on the left labia. Uterus is enlarged. Uterus is not fixed and not tender. Cervix exhibits no motion tenderness, no discharge and no friability. Right adnexum displays no mass, no tenderness and no fullness. Left adnexum displays no mass, no tenderness and no fullness.  Musculoskeletal: Normal range of motion.  Lymphadenopathy:    She has no cervical adenopathy.  Neurological: She is alert and oriented to person, place, and time. She has normal reflexes. No cranial nerve deficit. She exhibits normal muscle tone. Coordination normal.  Skin: Skin is warm and dry. No rash noted. She is not diaphoretic. No erythema. No pallor.  R inner thigh with lipomatous lesion 1 cm diameter.  Psychiatric: She has a normal mood and affect. Her behavior is normal. Judgment and thought content normal.   EKG:  Sinus brady at 57.  INFLUENZA VACCINE ADMINISTERED BY LISA ALBRIGHT.    Assessment & Plan:  Routine general medical examination at a health care facility - Plan: CBC with Differential, Comprehensive metabolic panel, Hemoglobin A1c, Lipid panel, TSH, Vitamin B12, Vit D  25 hydroxy (rtn osteoporosis monitoring), POCT urinalysis dipstick, EKG 12-Lead  Routine gynecological examination - Plan: Pap IG, CT/NG NAA, and HPV (high risk), MM Digital Screening  Need for prophylactic vaccination and inoculation against influenza - Plan: Flu Vaccine QUAD 36+ mos IM   1.  CPE: anticipatory guidance provided --- weight loss, exercise.  Pap smear obtained.  Refer for mammogram. Colonoscopy UTD.  S/p flu vaccine. Obtain labs. 2.  Gynecological exam:  Pap smear obtained; refer for mammogram.  Rx for OCP refilled; recommend stopping OCP at age 32. 3.  S/p flu vaccine.

## 2013-02-04 NOTE — Patient Instructions (Addendum)
1.  RECOMMEND WEIGHT LOSS OVER THE UPCOMING YEAR.  AVOID REGULAR SODAS, SWEET TEA, REGULAR SODAS (WHICH ARE LOADED WITH SUGAR).

## 2013-02-08 LAB — PAP IG, CT-NG NAA, HPV HIGH-RISK
Chlamydia Probe Amp: NEGATIVE
GC Probe Amp: NEGATIVE
HPV DNA High Risk: NOT DETECTED

## 2013-02-10 MED ORDER — METRONIDAZOLE 500 MG PO TABS: 500.0000 mg | ORAL_TABLET | Freq: Three times a day (TID) | ORAL | Status: DC

## 2013-02-10 NOTE — Addendum Note (Signed)
Addended by: Ethelda Chick on: 02/10/2013 04:26 PM   Modules accepted: Orders

## 2013-03-07 ENCOUNTER — Ambulatory Visit (INDEPENDENT_AMBULATORY_CARE_PROVIDER_SITE_OTHER): Payer: 59 | Admitting: Physician Assistant

## 2013-03-07 VITALS — BP 120/72 | HR 80 | Temp 98.1°F | Resp 20 | Ht 63.5 in | Wt 237.2 lb

## 2013-03-07 DIAGNOSIS — J069 Acute upper respiratory infection, unspecified: Secondary | ICD-10-CM

## 2013-03-07 MED ORDER — IPRATROPIUM BROMIDE 0.03 % NA SOLN: 2.0000 | Freq: Two times a day (BID) | NASAL | Status: DC

## 2013-03-07 MED ORDER — GUAIFENESIN ER 1200 MG PO TB12: 1.0000 | ORAL_TABLET | Freq: Two times a day (BID) | ORAL | Status: DC | PRN

## 2013-03-07 NOTE — Patient Instructions (Signed)
Get plenty of rest and drink at least 64 ounces of water daily. 

## 2013-03-07 NOTE — Progress Notes (Signed)
Subjective:    Patient ID: Kellie Santiago, female    DOB: [birth year], 52 y.o.   MRN: [id number]  HPI This 52 y.o. female presents for evaluation of 2 days of illness. Awoke yesterday morning with sore throat, neck soreness, headache.  Then developed cough.  A little runny nose and post nasal drip.  No fever/chills.  No vomiting or diarrhea.  A little nausea with the post nasal drainage.  Some upper body aches, not "all over."    Active Ambulatory Problems    Diagnosis Date Noted  . AR (allergic rhinitis) 06/15/2011  . Obesity, Class III, BMI 40-49.9 (morbid obesity) 06/15/2011   Resolved Ambulatory Problems    Diagnosis Date Noted  . No Resolved Ambulatory Problems   Past Medical History  Diagnosis Date  . Colon polyps 06/08/2010  . Allergy     Past Surgical History  Procedure Laterality Date  . Cesarean section      X 2  . Mandible surgery    . Colonoscopy w/ polypectomy  06/08/2010    5 polyps resected.  [facility].  Repeat in 5 years.    Allergies  Allergen Reactions  . Penicillins Rash    Prior to Admission medications   Medication Sig Start Date End Date Taking? Authorizing Provider  fluticasone (FLONASE) 50 MCG/ACT nasal spray Place 2 sprays into the nose daily.     Yes Historical Provider, MD  levocetirizine (XYZAL) 5 MG tablet Take 5 mg by mouth every evening.     Yes Historical Provider, MD  Multiple Vitamins-Minerals (MULTIVITAMIN PO) Take 1 tablet by mouth daily.   Yes Historical Provider, MD  norethindrone-ethinyl estradiol (ORTHO-NOVUM 7/7/7, 28,) 0.5/0.75/1-35 MG-MCG tablet Take 1 tablet by mouth daily. 02/04/13  Yes Ethelda Chick, MD    History   Social History  . Marital Status: Significant Other    Spouse Name: Freddi Starr    Number of Children: 2  . Years of Education: MBA   Occupational History  . Account Haematologist   Social History Main Topics  . Smoking status: Former Smoker    Types: Cigars  . Smokeless tobacco: None   Comment: smokes cigars only on holidays, with her dad  . Alcohol Use: 1.5 oz/week    3 drink(s) per week     Comment: SOCIALLY  . Drug Use: No  . Sexual Activity: Yes    Birth Control/ Protection: Pill   Other Topics Concern  . None   Social History Narrative   Marital status:  Divorced; dating x 14 years.  Happy.      Lives:  Alone      Exercise:  Walk/run couch to 5K.      Seatbelt: 100%       History  Sexual Activity  . Sexual Activity: Yes  . Birth Control/ Protection: Pill     family history includes Dementia in her father; Diabetes in her father and maternal grandmother; Hypertension in her brother, maternal grandmother, and mother. indicated that her mother is alive. She indicated that her father is alive. She indicated that both of her sisters are alive. She indicated that her brother is alive. She indicated that her maternal grandmother is alive. She indicated that her maternal grandfather is deceased. She indicated that her paternal grandmother is deceased. She indicated that her paternal grandfather is deceased. She indicated that both of her sons are alive.   Review of Systems As above.    Objective:   Physical  Exam  Vitals reviewed. Constitutional: She is oriented to person, place, and time. Vital signs are normal. She appears well-developed and well-nourished. No distress.  HENT:  Head: Normocephalic and atraumatic.  Right Ear: Hearing, tympanic membrane, external ear and ear canal normal.  Left Ear: Hearing, tympanic membrane, external ear and ear canal normal.  Nose: Mucosal edema and rhinorrhea present.  No foreign bodies. Right sinus exhibits no maxillary sinus tenderness and no frontal sinus tenderness. Left sinus exhibits no maxillary sinus tenderness and no frontal sinus tenderness.  Mouth/Throat: Uvula is midline, oropharynx is clear and moist and mucous membranes are normal. No edematous. No oropharyngeal exudate.  Eyes: Conjunctivae and EOM are normal.  Pupils are equal, round, and reactive to light. Right eye exhibits no discharge. Left eye exhibits no discharge. No scleral icterus.  Neck: Trachea normal, normal range of motion and full passive range of motion without pain. Neck supple. No mass and no thyromegaly present.  Cardiovascular: Normal rate, regular rhythm and normal heart sounds.   Pulmonary/Chest: Effort normal and breath sounds normal.  Lymphadenopathy:       Head (right side): No submandibular, no tonsillar, no preauricular, no posterior auricular and no occipital adenopathy present.       Head (left side): No submandibular, no tonsillar, no preauricular and no occipital adenopathy present.    She has no cervical adenopathy.       Right: No supraclavicular adenopathy present.       Left: No supraclavicular adenopathy present.  Neurological: She is alert and oriented to person, place, and time. She has normal strength. No cranial nerve deficit or sensory deficit.  Skin: Skin is warm, dry and intact. No rash noted.  Psychiatric: She has a normal mood and affect. Her speech is normal and behavior is normal.          Assessment & Plan:  Viral URI with cough - Plan: ipratropium (ATROVENT) 0.03 % nasal spray, Guaifenesin (MUCINEX MAXIMUM STRENGTH) 1200 MG TB12  Get plenty of rest and drink at least 64 ounces of water daily.  Fernande Bras, PA-C Physician Assistant-Certified [facility]

## 2013-03-28 ENCOUNTER — Ambulatory Visit: Payer: 59

## 2013-05-01 ENCOUNTER — Ambulatory Visit
Admission: RE | Admit: 2013-05-01 | Discharge: 2013-05-01 | Disposition: A | Discharge status: Home / Self Care | Payer: 59 | Source: Ambulatory Visit | Attending: Family Medicine | Admitting: Family Medicine

## 2013-05-01 DIAGNOSIS — Z01419 Encounter for gynecological examination (general) (routine) without abnormal findings: Secondary | ICD-10-CM

## 2013-08-21 ENCOUNTER — Ambulatory Visit (INDEPENDENT_AMBULATORY_CARE_PROVIDER_SITE_OTHER): Payer: 59 | Admitting: Emergency Medicine

## 2013-08-21 VITALS — BP 128/83 | HR 77 | Temp 98.6°F | Resp 18 | Wt 229.0 lb

## 2013-08-21 DIAGNOSIS — W57XXXA Bitten or stung by nonvenomous insect and other nonvenomous arthropods, initial encounter: Secondary | ICD-10-CM

## 2013-08-21 DIAGNOSIS — T148 Other injury of unspecified body region: Secondary | ICD-10-CM

## 2013-08-21 MED ORDER — METHYLPREDNISOLONE ACETATE 80 MG/ML IJ SUSP
120.0000 mg | Freq: Once | INTRAMUSCULAR | Status: AC
  Administered 2013-08-21: 120 mg via INTRAMUSCULAR

## 2013-08-21 MED ORDER — TRIAMCINOLONE ACETONIDE 0.1 % EX CREA: 1.0000 "application " | TOPICAL_CREAM | Freq: Two times a day (BID) | CUTANEOUS | Status: AC

## 2013-08-21 NOTE — Progress Notes (Signed)
[facility] [address] [place] [address] [phone]  Date:  08/21/2013   Name:  Kellie Santiago   DOB:  10/28/1960   MRN:  [id number]  PCP:  Aryaa Bunting,CHELLE, PA-C    Chief Complaint: Urticaria   History of Present Illness:  Kellie Santiago is a 53 y.o. very pleasant female patient who presents with the following:  Spent the night in a [employer] motel in [place] Saturday.  Yesterday developed numerous bites on her right arm and a few on her left arm.  They are itchy and burn.  No history of any visible insect contact or infestation.  No systemic symptoms  Her partner has several bites as well.  No improvement with over the counter medications or other home remedies. Denies other complaint or health concern today.   Patient Active Problem List   Diagnosis Date Noted  . AR (allergic rhinitis) 06/15/2011  . Obesity, Class III, BMI 40-49.9 (morbid obesity) 06/15/2011    Past Medical History  Diagnosis Date  . Colon polyps 06/08/2010    colonoscopy:  +5 polyps resected.  [facility].  . Allergy     allergy shots; followed by [place] [facility].    Past Surgical History  Procedure Laterality Date  . Cesarean section      X 2  . Mandible surgery    . Colonoscopy w/ polypectomy  06/08/2010    5 polyps resected.  [facility].  Repeat in 5 years.    History  Substance Use Topics  . Smoking status: Former Smoker    Types: Cigars  . Smokeless tobacco: Not on file     Comment: smokes cigars only on holidays, with her dad  . Alcohol Use: 1.5 oz/week    3 drink(s) per week     Comment: SOCIALLY    Family History  Problem Relation Age of Onset  . Hypertension Mother   . Diabetes Father   . Dementia Father   . Hypertension Brother   . Hypertension Maternal Grandmother   . Diabetes Maternal Grandmother     Allergies  Allergen Reactions  . Penicillins Rash    Medication list has been reviewed and updated.  Current Outpatient Prescriptions on File Prior to Visit  Medication  Sig Dispense Refill  . fluticasone (FLONASE) 50 MCG/ACT nasal spray Place 2 sprays into the nose daily.        Marland Kitchen levocetirizine (XYZAL) 5 MG tablet Take 5 mg by mouth every evening.        . Multiple Vitamins-Minerals (MULTIVITAMIN PO) Take 1 tablet by mouth daily.      . norethindrone-ethinyl estradiol (ORTHO-NOVUM 7/7/7, 28,) 0.5/0.75/1-35 MG-MCG tablet Take 1 tablet by mouth daily.  1 Package  11   No current facility-administered medications on file prior to visit.    Review of Systems:  As per HPI, otherwise negative.    Physical Examination: Filed Vitals:   08/21/13 1400  BP: 128/83  Pulse: 77  Temp: 98.6 F (37 C)  Resp: 18   Filed Vitals:   08/21/13 1400  Weight: 229 lb (103.874 kg)   Body mass index is 39.92 kg/(m^2). Ideal Body Weight:     GEN: WDWN, NAD, Non-toxic, Alert & Oriented x 3 HEENT: Atraumatic, Normocephalic.  Ears and Nose: No external deformity. EXTR: No clubbing/cyanosis/edema NEURO: Normal gait.  PSYCH: Normally interactive. Conversant. Not depressed or anxious appearing.  Calm demeanor.  SKIN:  Right arm.  Extensive erythematous lesions on right arm.  Broad with a punctate vesicle  on the lesion.  Appear characteristic to reference photo for bedbug bites.   Assessment and Plan: Bed bug bites TAC Depo Medrol Benadryl   Signed,  Phillips OdorJeffery Johnmatthew Solorio, MD

## 2013-09-12 ENCOUNTER — Ambulatory Visit (INDEPENDENT_AMBULATORY_CARE_PROVIDER_SITE_OTHER): Payer: 59 | Admitting: Emergency Medicine

## 2013-09-12 VITALS — BP 124/84 | HR 70 | Temp 98.0°F | Resp 16 | Ht 63.0 in | Wt 224.0 lb

## 2013-09-12 DIAGNOSIS — J069 Acute upper respiratory infection, unspecified: Secondary | ICD-10-CM

## 2013-09-12 DIAGNOSIS — B9789 Other viral agents as the cause of diseases classified elsewhere: Principal | ICD-10-CM

## 2013-09-12 MED ORDER — PSEUDOEPHEDRINE HCL 30 MG PO TABS: 30.0000 mg | ORAL_TABLET | ORAL | Status: DC | PRN

## 2013-09-12 MED ORDER — GUAIFENESIN-CODEINE 100-10 MG/5ML PO SOLN: 5.0000 mL | Freq: Four times a day (QID) | ORAL | Status: DC | PRN

## 2013-09-12 NOTE — Progress Notes (Signed)
   Subjective:    Patient ID: Kellie Santiago, female    DOB: [birth year], 53 y.o.   MRN: [id number]  HPI Runny nose, congestion, sore throat and cough.  Onset 5 days ago.  No fever.  Mild headache.  Cough is nonproductive, worse at night.  No hearing changes.  PPMH:  No Hypertension.  FH:   Htn, DM  SH:  Quit smoking, occasional alcohol   Review of Systems  Constitutional: Positive for fatigue. Negative for fever and chills.  HENT: Positive for congestion, postnasal drip, rhinorrhea and sore throat. Negative for hearing loss.   Respiratory: Positive for cough. Negative for choking, chest tightness and shortness of breath.   Cardiovascular: Negative for chest pain, palpitations and leg swelling.  Musculoskeletal: Negative for myalgias.  Neurological: Positive for headaches. Negative for weakness and numbness.       Objective:   Physical Exam Blood pressure 124/84, pulse 70, temperature 98 F (36.7 C), temperature source Oral, resp. rate 16, height 5\' 3"  (1.6 m), weight 224 lb (101.606 kg), last menstrual period 09/04/2013, SpO2 98.00%. Body mass index is 39.69 kg/(m^2). Well-developed, well nourished female who is awake, alert and oriented, in NAD. HEENT: [place]/AT, PERRL, EOMI.  Sclera and conjunctiva are clear.  EAC are patent.  Nasal mucosal edema, OP with cobblestoning Neck: supple, non-tender, no lymphadenopathy, thyromegaly. Heart: RRR, no murmur Lungs: normal effort, CTA Abdomen: normo-active bowel sounds, supple, non-tender, no mass or organomegaly. Extremities: no cyanosis, clubbing or edema. Skin: warm and dry without rash. Psychologic: good mood and appropriate affect, normal speech and behavior.       Assessment & Plan:  Viral URI with cough  Sudafed, Codeine cough syrup, take NSAIDS prn and salt water garggle.

## 2013-09-12 NOTE — Patient Instructions (Signed)

## 2014-01-13 IMAGING — CT CT HEAD W/O CM
2 series · 16 of 30 positions shown, 18 images · non-contrast
Comparison: None.

CLINICAL DATA: Head trauma yesterday with headache and nausea.

CT HEAD WITHOUT CONTRAST
TECHNIQUE: Contiguous axial images were obtained from the base of
the skull through the vertex without contrast.

[Series 2: head w/o · axial · non-contrast · 0.49mm/px · z∈[+41,+149]mm · 8 of 28 slices shown, 10 images]
[im 4/28  brain]
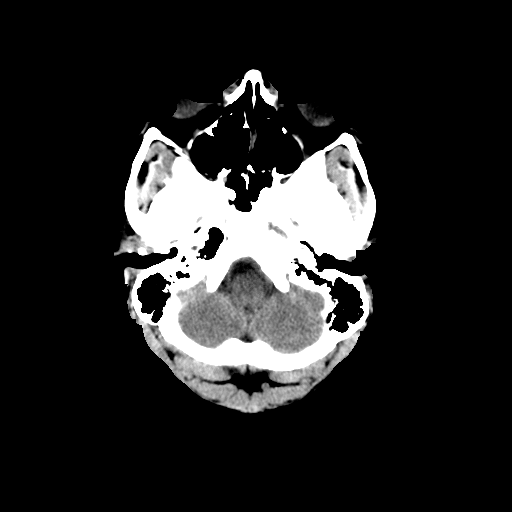
[im 4/28  bone]
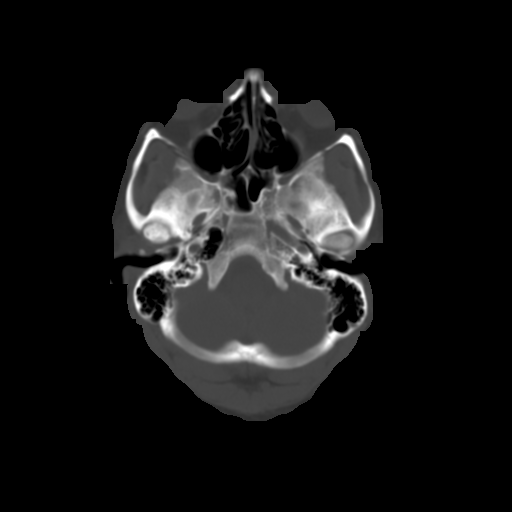
[im 7/28  brain]
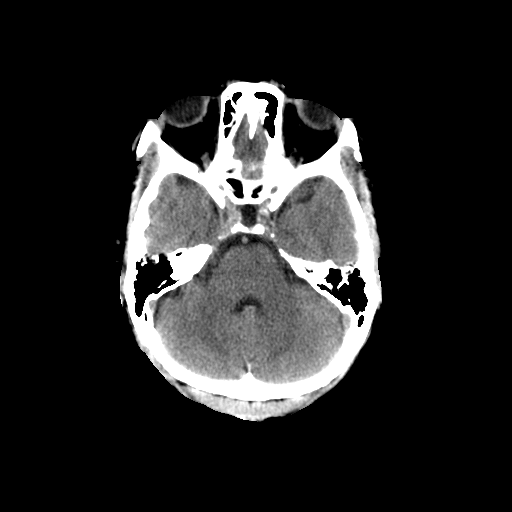
[im 10/28  brain]
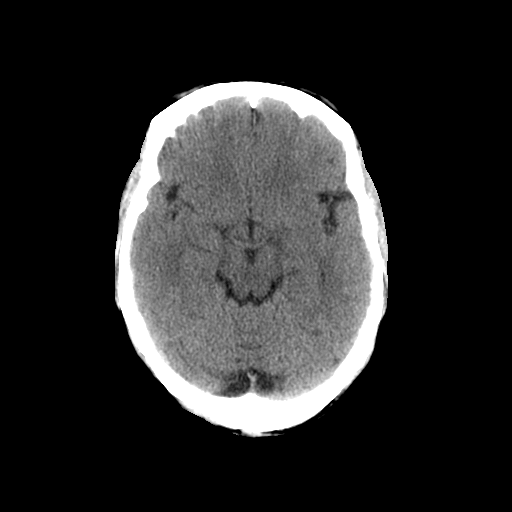
[im 13/28  brain]
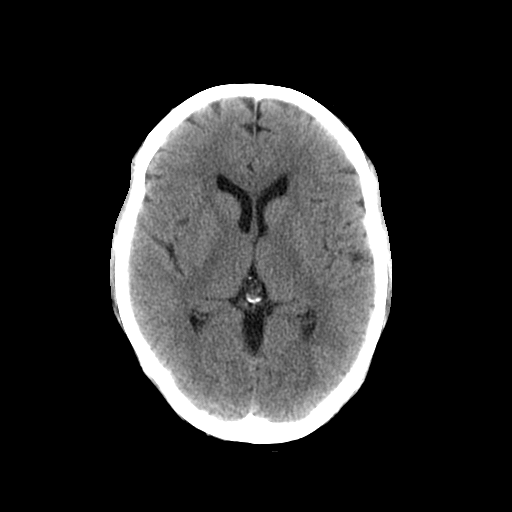
[im 16/28  brain]
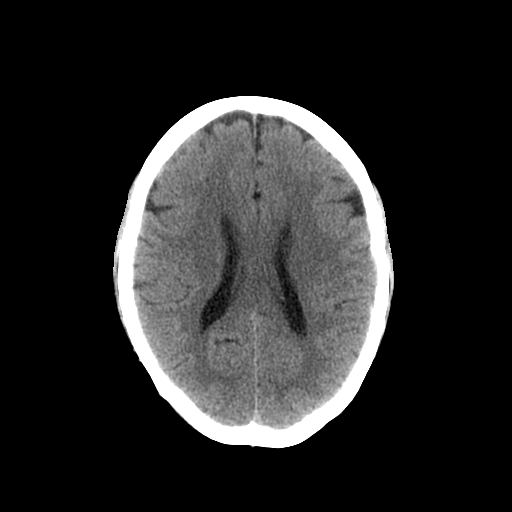
[im 16/28  bone]
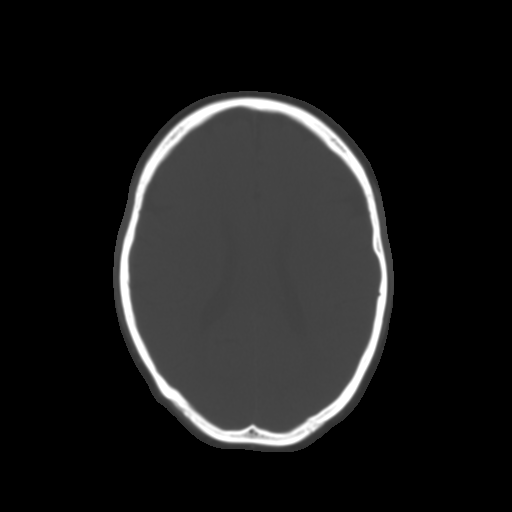
[im 19/28  brain]
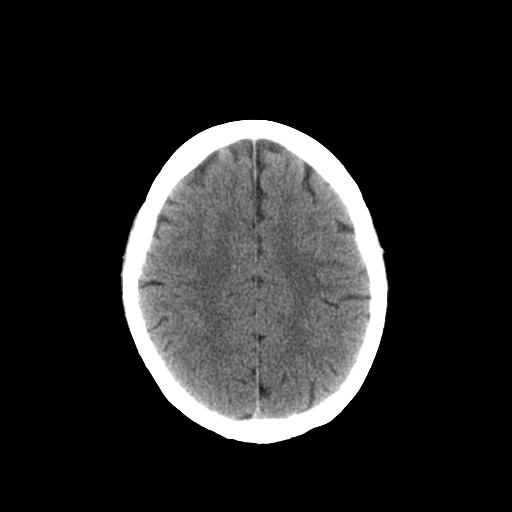
[im 22/28  brain]
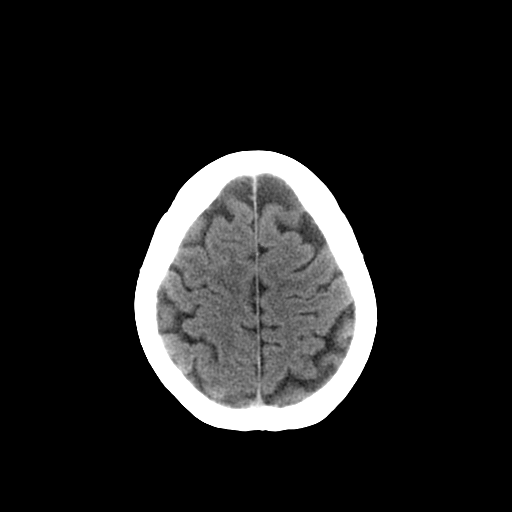
[im 25/28  brain]
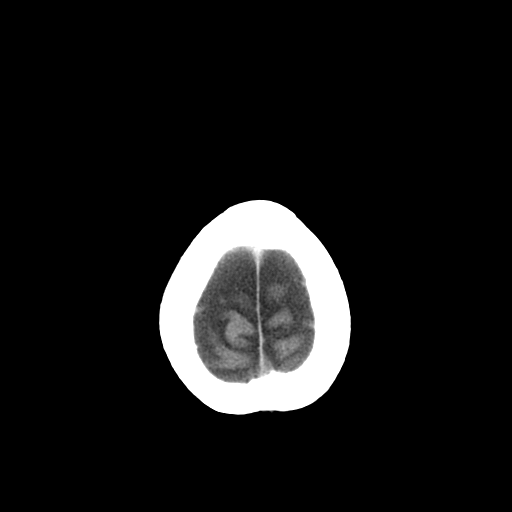

[Series 3: head bone · axial · 0.49mm/px · z∈[+37,+150]mm · 8 of 56 slices shown]
[im 6/56  bone]
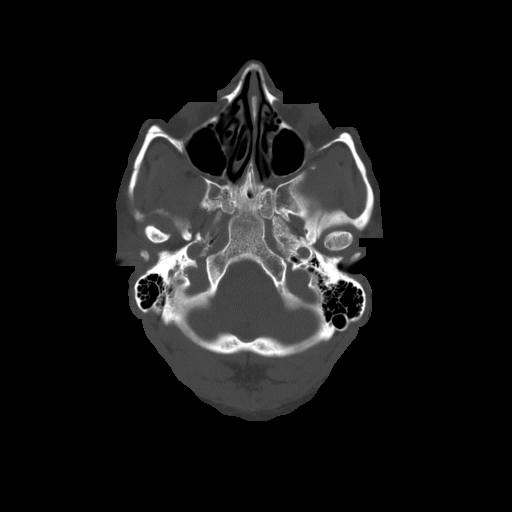
[im 12/56  bone]
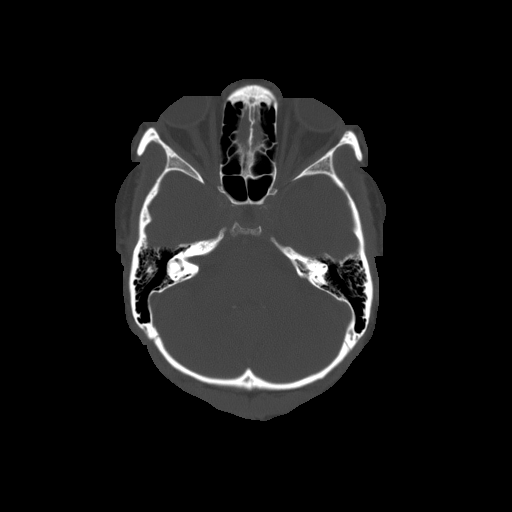
[im 18/56  bone]
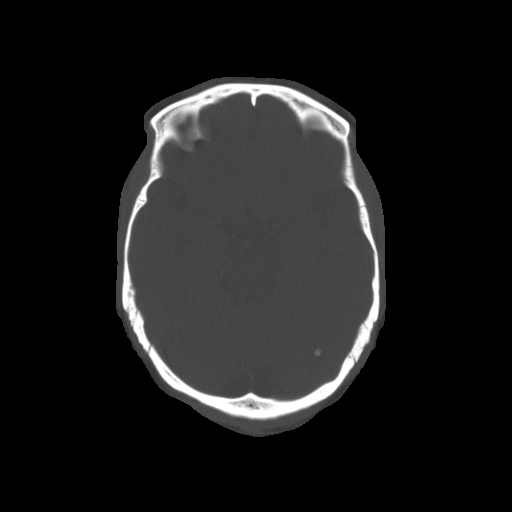
[im 24/56  bone]
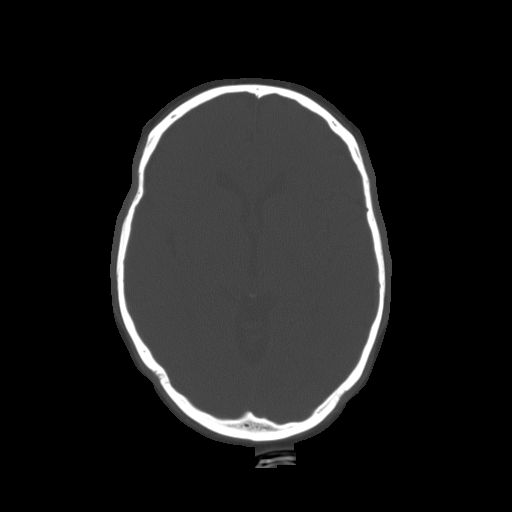
[im 32/56  bone]
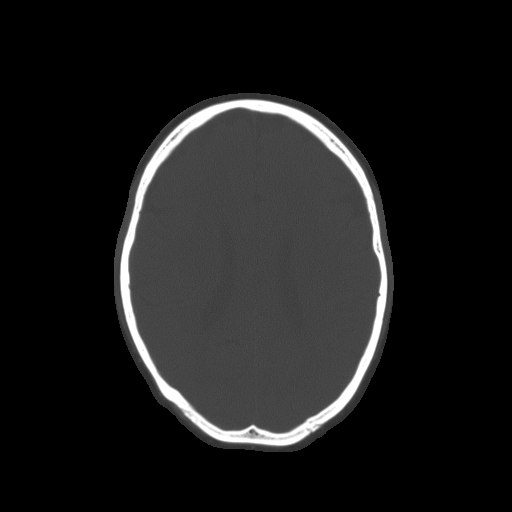
[im 38/56  bone]
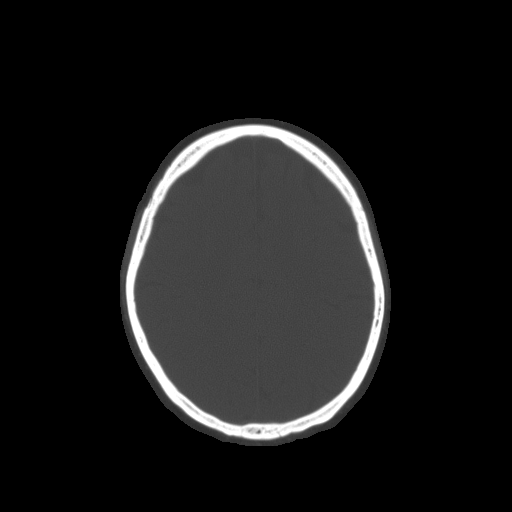
[im 44/56  bone]
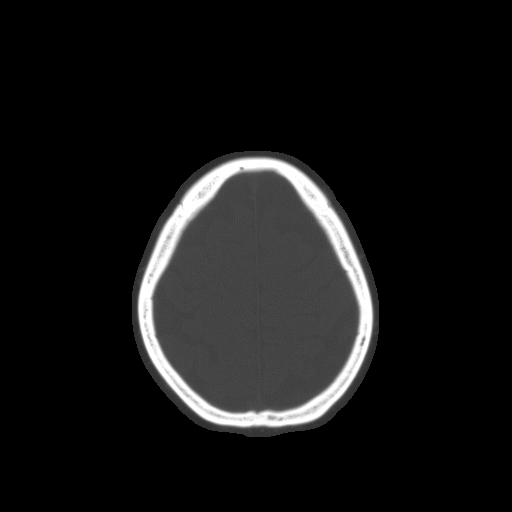
[im 50/56  bone]
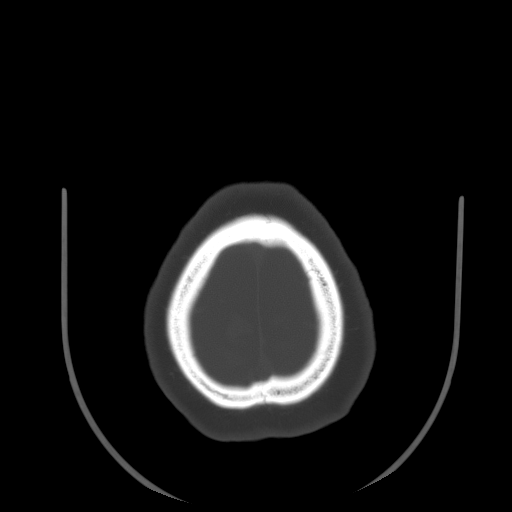

[16 of 30 positions shown; findings below may reference images not displayed]

FINDINGS: The brain has a normal appearance without evidence of
malformation, atrophy, old or acute infarction, mass lesion,
hemorrhage, hydrocephalus or extra-axial collection.  No skull
fracture.  No fluid in the sinuses, middle ears or mastoids.
IMPRESSION: Normal head CT.  No post-traumatic finding.

## 2014-01-14 ENCOUNTER — Other Ambulatory Visit: Payer: Self-pay | Admitting: Family Medicine

## 2014-02-07 ENCOUNTER — Ambulatory Visit (INDEPENDENT_AMBULATORY_CARE_PROVIDER_SITE_OTHER): Payer: 59 | Admitting: Family Medicine

## 2014-02-07 ENCOUNTER — Encounter: Payer: Self-pay | Admitting: Family Medicine

## 2014-02-07 VITALS — BP 132/84 | HR 93 | Temp 98.4°F | Resp 16 | Ht 63.5 in | Wt 232.2 lb

## 2014-02-07 DIAGNOSIS — Z3041 Encounter for surveillance of contraceptive pills: Secondary | ICD-10-CM

## 2014-02-07 DIAGNOSIS — Z0000 Encounter for general adult medical examination without abnormal findings: Secondary | ICD-10-CM

## 2014-02-07 DIAGNOSIS — Z01419 Encounter for gynecological examination (general) (routine) without abnormal findings: Secondary | ICD-10-CM

## 2014-02-07 DIAGNOSIS — Z7251 High risk heterosexual behavior: Secondary | ICD-10-CM

## 2014-02-07 DIAGNOSIS — Z23 Encounter for immunization: Secondary | ICD-10-CM

## 2014-02-07 LAB — POCT URINALYSIS DIPSTICK
Bilirubin, UA: NEGATIVE
GLUCOSE UA: NEGATIVE
KETONES UA: 15
Leukocytes, UA: NEGATIVE
Nitrite, UA: NEGATIVE
PH UA: 5
Protein, UA: NEGATIVE
RBC UA: NEGATIVE
Spec Grav, UA: <=1.005
Urobilinogen, UA: 0.2

## 2014-02-07 MED ORDER — NORETHIN-ETH ESTRAD TRIPHASIC 0.5/0.75/1-35 MG-MCG PO TABS: 1.0000 | ORAL_TABLET | Freq: Every day | ORAL | Status: DC

## 2014-02-07 NOTE — Progress Notes (Addendum)
Patient ID: Kellie Santiago, female   DOB: July 05, 1960, 53 y.o.   MRN: [id number]   Subjective:  This chart was scribed for Reginia Forts, MD by Donato Schultz, Medical Scribe. This patient was seen in Room 22 and the patient's care was started at 2:01 PM.   Patient ID: Kellie Santiago, female    DOB: 08/26/1960, 53 y.o.   MRN: [id number]  02/07/2014  Annual Exam  HPI HPI Comments: Kellie Santiago is a 53 y.o. female who presents to the [facility] for a CPE.  Her last CPE on 02/04/13 with Dr. Tamala Julian.  She had extensive labs done at that time which were normal except for an elevated ALT and AST.  Her PAP smear revealed scant cells which required her to repeat the PAP in a year.  Her last normal mammogram was on 05/01/13.  Her last colonoscopy was 2012 which revealed polyps.  Dr. Collene Mares recommended a repeat colonoscopy in 5 years.  Her TDAP was in 2013.  She received a flu vaccine last year.  She is medicated on birth control pills and she has continued her medication. She had an eye exam a few months ago and got new glasses.  There was no presence of glaucoma or cataracts.  She has a dental appointment on the 17th of this month.  She has not had any new health problems.  She gets her allergy shots with Dr. Brigitte Pulse.  She has not had any surgeries or hospitalizations this year.  She would like a flu vaccine today.    She is allergic to Penicillins.  She does not take Flonase daily.  She takes Xyzal as needed.  She takes Dance movement psychotherapist daily.  She is still taking birth control.  She no longer takes Sudafed.  She uses Kenalog as needed.    Her mother is 12 has a history of hyperlipidemia and hypertension.  She does not have a history of MI or TIA.  Her father is 72 and has a history of DM, Dementia, and Alzheimer's.  He lives at a nursing home but walks freely and has gained 20 pounds in the last 4 months.  Her sisters do not have any health problems.  Her brother has a history of hypertension.  She has  been with the same man for 15 years and there is no history of abuse.  She is currently living with her partner.  She estimates that she has been sexually active with 6 people in her lifetime.  She does not have a history of STDs or STIs.  She has not had an HIV test recently.  She does not do at-home breast exams.  She has one 53 year old, non-biological grandchild.  She has been working at the same job for 12 years in [place].  She likes her job most of the time.  She does not use any tobacco but will have a celebratory cigar intermittently.  She has decreased the amount of wine she drinks during the week and drinks a couple of glasses on the weekend.  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  She wears her seatbelt all the time.  There are no guns in the home.    She states that she will intermittently experience some fluttering in her heart and chest pain at night.  She believes is related to indigestion or anxiety.  She gets up every morning at either 2:30 AM or 3:30 AM to urinate and cannot get back to sleep.  She will intermittently leak urine during the day.  She states that the exercise has helped her sleep a lot better.  Review of Systems  Constitutional: Negative for fever, chills, diaphoresis, activity change, appetite change, fatigue and unexpected weight change.  HENT: Negative for congestion, dental problem, drooling, ear discharge, ear pain, facial swelling, hearing loss, mouth sores, nosebleeds, postnasal drip, rhinorrhea, sinus pressure, sneezing, sore throat, tinnitus, trouble swallowing and voice change.   Eyes: Negative for photophobia, pain, discharge, redness, itching and visual disturbance.  Respiratory: Negative for apnea, cough, choking, chest tightness, shortness of breath, wheezing and stridor.   Cardiovascular: Positive for palpitations. Negative for chest pain and leg swelling.  Gastrointestinal: Negative for nausea, vomiting, abdominal pain, diarrhea, constipation, blood in  stool, abdominal distention, anal bleeding and rectal pain.  Endocrine: Negative for cold intolerance, heat intolerance, polydipsia, polyphagia and polyuria.  Genitourinary: Negative for dysuria, urgency, frequency, hematuria, flank pain, decreased urine volume, vaginal bleeding, vaginal discharge, enuresis, difficulty urinating, genital sores, vaginal pain, menstrual problem, pelvic pain and dyspareunia.  Musculoskeletal: Negative for arthralgias, back pain, gait problem, joint swelling, myalgias, neck pain and neck stiffness.  Skin: Negative for color change, pallor, rash and wound.  Allergic/Immunologic: Negative for environmental allergies, food allergies and immunocompromised state.  Neurological: Negative for dizziness, tremors, seizures, syncope, facial asymmetry, speech difficulty, weakness, light-headedness, numbness and headaches.  Hematological: Negative for adenopathy. Does not bruise/bleed easily.  Psychiatric/Behavioral: Negative for suicidal ideas, hallucinations, behavioral problems, confusion, sleep disturbance, self-injury, dysphoric mood, decreased concentration and agitation. The patient is not nervous/anxious and is not hyperactive.   All other systems reviewed and are negative.   Past Medical History  Diagnosis Date  . Colon polyps 06/08/2010    colonoscopy:  +5 polyps resected.  [facility].  . Allergy     allergy shots; followed by Donneta Romberg.   Past Surgical History  Procedure Laterality Date  . Cesarean section      X 2  . Mandible surgery    . Colonoscopy w/ polypectomy  04/24/2011    5 polyps resected (Tubular adenoma, hyperplastic polyp).  [facility].  Repeat in 5 years.   Allergies  Allergen Reactions  . Penicillins Rash   Current Outpatient Prescriptions  Medication Sig Dispense Refill  . fluticasone (FLONASE) 50 MCG/ACT nasal spray Place 2 sprays into the nose daily.        Marland Kitchen levocetirizine (XYZAL) 5 MG tablet Take 5 mg by mouth every evening.        . Multiple  Vitamins-Minerals (MULTIVITAMIN PO) Take 1 tablet by mouth daily.      . norethindrone-ethinyl estradiol (ORTHO-NOVUM 7/7/7, 28,) 0.5/0.75/1-35 MG-MCG tablet Take 1 tablet by mouth daily. PATIENT NEEDS OFFICE VISIT FOR ADDITIONAL REFILLS  28 tablet  12  . triamcinolone cream (KENALOG) 0.1 % Apply 1 application topically 2 (two) times daily.  30 g  0   No current facility-administered medications for this visit.     Objective:   BP 132/84  Pulse 93  Temp(Src) 98.4 F (36.9 C) (Oral)  Resp 16  Ht 5' 3.5" (1.613 m)  Wt 232 lb 3.2 oz (105.325 kg)  BMI 40.48 kg/m2  SpO2 98%  LMP 01/17/2014  Physical Exam  Nursing note and vitals reviewed. Constitutional: She is oriented to person, place, and time. She appears well-developed and well-nourished. No distress.  HENT:  Head: Normocephalic and atraumatic.  Right Ear: External ear normal.  Left Ear: External ear normal.  Nose: Nose normal.  Mouth/Throat: Oropharynx is clear and moist.  No oropharyngeal exudate.  Eyes: Conjunctivae and EOM are normal. Pupils are equal, round, and reactive to light.  Neck: Normal range of motion and full passive range of motion without pain. Neck supple. No JVD present. Carotid bruit is not present. No thyromegaly present.  Cardiovascular: Normal rate, regular rhythm and normal heart sounds.  Exam reveals no gallop and no friction rub.   No murmur heard. Pulmonary/Chest: Effort normal and breath sounds normal. No respiratory distress. She has no wheezes. She has no rales. Right breast exhibits tenderness. Right breast exhibits no inverted nipple, no mass, no nipple discharge and no skin change. Left breast exhibits tenderness. Left breast exhibits no inverted nipple, no mass, no nipple discharge and no skin change. Breasts are symmetrical.  Tender to palpation at 10 o'clock with some fibrous, dense tissue at the right breast.    Abdominal: Soft. Bowel sounds are normal. She exhibits no distension and no mass.  There is no tenderness. There is no rebound and no guarding.  Genitourinary: Vagina normal. There is breast tenderness. No breast swelling, discharge or bleeding. There is no rash, tenderness, lesion or injury on the right labia. There is no rash, tenderness, lesion or injury on the left labia. Uterus is enlarged. Cervix exhibits no motion tenderness, no discharge and no friability. Right adnexum displays no mass, no tenderness and no fullness. Left adnexum displays no mass, no tenderness and no fullness.  Uterus was enlarged.  Musculoskeletal: Normal range of motion.       Right shoulder: Normal.       Left shoulder: Normal.       Cervical back: Normal.  Lymphadenopathy:    She has no cervical adenopathy.  Neurological: She is alert and oriented to person, place, and time. She has normal reflexes. No cranial nerve deficit. She exhibits normal muscle tone. Coordination normal.  Skin: Skin is warm and dry. No rash noted. She is not diaphoretic. No erythema. No pallor.  Psychiatric: She has a normal mood and affect. Her behavior is normal. Judgment and thought content normal.   Results for orders placed in visit on 02/07/14  CBC WITH DIFFERENTIAL      Result Value Ref Range   WBC 9.4  4.0 - 10.5 K/uL   RBC 4.81  3.87 - 5.11 MIL/uL   Hemoglobin 12.8  12.0 - 15.0 g/dL   HCT 38.9  36.0 - 46.0 %   MCV 80.9  78.0 - 100.0 fL   MCH 26.6  26.0 - 34.0 pg   MCHC 32.9  30.0 - 36.0 g/dL   RDW 14.8  11.5 - 15.5 %   Platelets 279  150 - 400 K/uL   Neutrophils Relative % 76  43 - 77 %   Neutro Abs 7.1  1.7 - 7.7 K/uL   Lymphocytes Relative 18  12 - 46 %   Lymphs Abs 1.7  0.7 - 4.0 K/uL   Monocytes Relative 5  3 - 12 %   Monocytes Absolute 0.5  0.1 - 1.0 K/uL   Eosinophils Relative 1  0 - 5 %   Eosinophils Absolute 0.1  0.0 - 0.7 K/uL   Basophils Relative 0  0 - 1 %   Basophils Absolute 0.0  0.0 - 0.1 K/uL   Smear Review Criteria for review not met    COMPLETE METABOLIC PANEL WITH GFR      Result  Value Ref Range   Sodium 141  135 - 145 mEq/L   Potassium 4.3  3.5 -  5.3 mEq/L   Chloride 106  96 - 112 mEq/L   CO2 24  19 - 32 mEq/L   Glucose, Bld 105 (*) 70 - 99 mg/dL   BUN 15  6 - 23 mg/dL   Creat 4.32  0.03 - 7.94 mg/dL   Total Bilirubin 0.6  0.2 - 1.2 mg/dL   Alkaline Phosphatase 50  39 - 117 U/L   AST 22  0 - 37 U/L   ALT 18  0 - 35 U/L   Total Protein 7.2  6.0 - 8.3 g/dL   Albumin 4.4  3.5 - 5.2 g/dL   Calcium 9.5  8.4 - 44.6 mg/dL   GFR, Est African American 77     GFR, Est Non African American 67    TSH      Result Value Ref Range   TSH 2.963  0.350 - 4.500 uIU/mL  VITAMIN D 25 HYDROXY      Result Value Ref Range   Vit D, 25-Hydroxy 25 (*) 30 - 89 ng/mL  RPR      Result Value Ref Range   RPR NON REAC  NON REAC  HIV ANTIBODY (ROUTINE TESTING)      Result Value Ref Range   HIV 1&2 Ab, 4th Generation NONREACTIVE  NONREACTIVE  POCT URINALYSIS DIPSTICK      Result Value Ref Range   Color, UA yellow     Clarity, UA clear     Glucose, UA neg     Bilirubin, UA neg     Ketones, UA 15     Spec Grav, UA <=1.005     Blood, UA neg     pH, UA 5.0     Protein, UA neg     Urobilinogen, UA 0.2     Nitrite, UA neg     Leukocytes, UA Negative    PAP IG, CT-NG NAA, HPV HIGH-RISK      Result Value Ref Range   HPV DNA High Risk Not Detected     Specimen adequacy: SEE NOTE     FINAL DIAGNOSIS: SEE NOTE     Cytotechnologist: SEE NOTE     Chlamydia Probe Amp NEGATIVE     GC Probe Amp NEGATIVE       Assessment & Plan:   1. Routine general medical examination at a health care facility   2. Routine gynecological examination   3. Problems related to high-risk sexual behavior   4. Need for prophylactic vaccination and inoculation against influenza   5. Encounter for surveillance of contraceptive pills    1. Complete Physical Examination: anticipatory guidance --- exercise, weight loss. Pap smear obtained; mammogram UTD. Colonoscopy UTD.  Immunizations UTD: s/p flu vaccine  in office. 2.  Gynecological exam: pap smear obtained; mammogram UTD. Continue OCP until age 74.   3. High risk sexual behavior/STD screening: obtain HIV, RPR, GC/Chlam.  HIV counseling provided during visit. 4.  S/p flu vaccine. 5.  Contraception management: refill of OCP provided; will discontinue OCP next year at age 89.   Meds ordered this encounter  Medications  . norethindrone-ethinyl estradiol (ORTHO-NOVUM 7/7/7, 28,) 0.5/0.75/1-35 MG-MCG tablet    Sig: Take 1 tablet by mouth daily. PATIENT NEEDS OFFICE VISIT FOR ADDITIONAL REFILLS    Dispense:  28 tablet    Refill:  12    Return in about 1 year (around 02/08/2015) for complete physical examiniation.  I personally performed the services described in this documentation, which was scribed in my presence.  The  recorded information has been reviewed and is accurate.   Reginia Forts, M.D.  [facility] [address] [place], [place]  [address] [phone] phone [phone] fax

## 2014-02-07 NOTE — Patient Instructions (Signed)

## 2014-02-08 LAB — CBC WITH DIFFERENTIAL/PLATELET
Basophils Absolute: 0 10*3/uL (ref 0.0–0.1)
Basophils Relative: 0 % (ref 0–1)
Eosinophils Absolute: 0.1 10*3/uL (ref 0.0–0.7)
Eosinophils Relative: 1 % (ref 0–5)
HEMATOCRIT: 38.9 % (ref 36.0–46.0)
Hemoglobin: 12.8 g/dL (ref 12.0–15.0)
LYMPHS ABS: 1.7 10*3/uL (ref 0.7–4.0)
Lymphocytes Relative: 18 % (ref 12–46)
MCH: 26.6 pg (ref 26.0–34.0)
MCHC: 32.9 g/dL (ref 30.0–36.0)
MCV: 80.9 fL (ref 78.0–100.0)
MONOS PCT: 5 % (ref 3–12)
Monocytes Absolute: 0.5 10*3/uL (ref 0.1–1.0)
NEUTROS ABS: 7.1 10*3/uL (ref 1.7–7.7)
NEUTROS PCT: 76 % (ref 43–77)
Platelets: 279 10*3/uL (ref 150–400)
RBC: 4.81 MIL/uL (ref 3.87–5.11)
RDW: 14.8 % (ref 11.5–15.5)
WBC: 9.4 10*3/uL (ref 4.0–10.5)

## 2014-02-08 LAB — HIV ANTIBODY (ROUTINE TESTING W REFLEX): HIV 1&2 Ab, 4th Generation: NONREACTIVE

## 2014-02-08 LAB — COMPLETE METABOLIC PANEL WITH GFR
ALBUMIN: 4.4 g/dL (ref 3.5–5.2)
ALK PHOS: 50 U/L (ref 39–117)
ALT: 18 U/L (ref 0–35)
AST: 22 U/L (ref 0–37)
BUN: 15 mg/dL (ref 6–23)
CALCIUM: 9.5 mg/dL (ref 8.4–10.5)
CO2: 24 meq/L (ref 19–32)
Chloride: 106 mEq/L (ref 96–112)
Creat: 0.97 mg/dL (ref 0.50–1.10)
GFR, EST AFRICAN AMERICAN: 77 mL/min
GFR, Est Non African American: 67 mL/min
GLUCOSE: 105 mg/dL — AB (ref 70–99)
POTASSIUM: 4.3 meq/L (ref 3.5–5.3)
SODIUM: 141 meq/L (ref 135–145)
TOTAL PROTEIN: 7.2 g/dL (ref 6.0–8.3)
Total Bilirubin: 0.6 mg/dL (ref 0.2–1.2)

## 2014-02-08 LAB — TSH: TSH: 2.963 u[IU]/mL (ref 0.350–4.500)

## 2014-02-08 LAB — RPR

## 2014-02-09 LAB — VITAMIN D 25 HYDROXY (VIT D DEFICIENCY, FRACTURES): Vit D, 25-Hydroxy: 25 ng/mL — ABNORMAL LOW (ref 30–89)

## 2014-02-09 LAB — PAP IG, CT-NG NAA, HPV HIGH-RISK
Chlamydia Probe Amp: NEGATIVE
GC PROBE AMP: NEGATIVE
HPV DNA High Risk: NOT DETECTED

## 2014-12-12 ENCOUNTER — Ambulatory Visit (INDEPENDENT_AMBULATORY_CARE_PROVIDER_SITE_OTHER): Payer: 59 | Admitting: Family Medicine

## 2014-12-12 ENCOUNTER — Ambulatory Visit (INDEPENDENT_AMBULATORY_CARE_PROVIDER_SITE_OTHER): Payer: 59

## 2014-12-12 VITALS — BP 126/78 | HR 93 | Temp 98.1°F | Resp 17 | Ht 63.0 in | Wt 231.4 lb

## 2014-12-12 DIAGNOSIS — H8110 Benign paroxysmal vertigo, unspecified ear: Secondary | ICD-10-CM

## 2014-12-12 DIAGNOSIS — R42 Dizziness and giddiness: Secondary | ICD-10-CM

## 2014-12-12 DIAGNOSIS — E860 Dehydration: Secondary | ICD-10-CM | POA: Diagnosis not present

## 2014-12-12 DIAGNOSIS — G4489 Other headache syndrome: Secondary | ICD-10-CM

## 2014-12-12 DIAGNOSIS — R0789 Other chest pain: Secondary | ICD-10-CM

## 2014-12-12 DIAGNOSIS — R110 Nausea: Secondary | ICD-10-CM | POA: Diagnosis not present

## 2014-12-12 LAB — COMPLETE METABOLIC PANEL WITHOUT GFR
ALT: 17 U/L (ref 0–35)
AST: 19 U/L (ref 0–37)
Calcium: 9.1 mg/dL (ref 8.4–10.5)
Chloride: 104 meq/L (ref 96–112)
Creat: 0.87 mg/dL (ref 0.50–1.10)

## 2014-12-12 LAB — POCT CBC
Granulocyte percent: 73.2 % (ref 37–80)
HCT, POC: 39.2 % (ref 37.7–47.9)
Hemoglobin: 12.5 g/dL (ref 12.2–16.2)
Lymph, poc: 2.6 (ref 0.6–3.4)
MCH, POC: 25.3 pg — AB (ref 27–31.2)
MCHC: 31.8 g/dL (ref 31.8–35.4)
MCV: 79.5 fL — AB (ref 80–97)
MID (cbc): 0.2 (ref 0–0.9)
MPV: 7.9 fL (ref 0–99.8)
POC Granulocyte: 7.7 — AB (ref 2–6.9)
POC LYMPH PERCENT: 24.7 %L (ref 10–50)
POC MID %: 2.1 %M (ref 0–12)
Platelet Count, POC: 260 10*3/uL (ref 142–424)
RBC: 4.94 M/uL (ref 4.04–5.48)
RDW, POC: 15.8 %
WBC: 10.5 10*3/uL — AB (ref 4.6–10.2)

## 2014-12-12 LAB — POCT UA - MICROSCOPIC ONLY
Casts, Ur, LPF, POC: NEGATIVE
Crystals, Ur, HPF, POC: NEGATIVE
Mucus, UA: NEGATIVE
Yeast, UA: NEGATIVE

## 2014-12-12 LAB — TSH: TSH: 1.897 u[IU]/mL (ref 0.350–4.500)

## 2014-12-12 LAB — COMPLETE METABOLIC PANEL WITH GFR
Albumin: 4.1 g/dL (ref 3.5–5.2)
Alkaline Phosphatase: 53 U/L (ref 39–117)
BUN: 15 mg/dL (ref 6–23)
CO2: 22 mEq/L (ref 19–32)
GFR, Est African American: 88 mL/min
GFR, Est Non African American: 76 mL/min
Glucose, Bld: 86 mg/dL (ref 70–99)
Potassium: 4.2 mEq/L (ref 3.5–5.3)
Sodium: 138 mEq/L (ref 135–145)
Total Bilirubin: 0.4 mg/dL (ref 0.2–1.2)
Total Protein: 7.3 g/dL (ref 6.0–8.3)

## 2014-12-12 LAB — POCT URINALYSIS DIPSTICK
Bilirubin, UA: NEGATIVE
Blood, UA: NEGATIVE
Glucose, UA: NEGATIVE
Ketones, UA: 15
Leukocytes, UA: NEGATIVE
Nitrite, UA: NEGATIVE
Protein, UA: NEGATIVE
Spec Grav, UA: >=1.03
Urobilinogen, UA: 0.2
pH, UA: 5.5

## 2014-12-12 LAB — TROPONIN I: Troponin I: <0.01 ng/mL (ref <0.06)

## 2014-12-12 LAB — GLUCOSE, POCT (MANUAL RESULT ENTRY): POC Glucose: 92 mg/dL (ref 70–99)

## 2014-12-12 MED ORDER — GI COCKTAIL ~~LOC~~: 30.0000 mL | Freq: Every day | ORAL | Status: DC | PRN

## 2014-12-12 MED ORDER — DIAZEPAM 2 MG PO TABS: 2.0000 mg | ORAL_TABLET | Freq: Three times a day (TID) | ORAL | Status: AC | PRN

## 2014-12-12 MED ORDER — MECLIZINE HCL 25 MG PO TABS: 25.0000 mg | ORAL_TABLET | Freq: Three times a day (TID) | ORAL | Status: AC | PRN

## 2014-12-12 NOTE — Patient Instructions (Signed)
Benign Positional Vertigo °Vertigo means you feel like you or your surroundings are moving when they are not. Benign positional vertigo is the most common form of vertigo. Benign means that the cause of your condition is not serious. Benign positional vertigo is more common in older adults. °CAUSES  °Benign positional vertigo is the result of an upset in the labyrinth system. This is an area in the middle ear that helps control your balance. This may be caused by a viral infection, head injury, or repetitive motion. However, often no specific cause is found. °SYMPTOMS  °Symptoms of benign positional vertigo occur when you move your head or eyes in different directions. Some of the symptoms may include: °· Loss of balance and falls. °· Vomiting. °· Blurred vision. °· Dizziness. °· Nausea. °· Involuntary eye movements (nystagmus). °DIAGNOSIS  °Benign positional vertigo is usually diagnosed by physical exam. If the specific cause of your benign positional vertigo is unknown, your caregiver may perform imaging tests, such as magnetic resonance imaging (MRI) or computed tomography (CT). °TREATMENT  °Your caregiver may recommend movements or procedures to correct the benign positional vertigo. Medicines such as meclizine, benzodiazepines, and medicines for nausea may be used to treat your symptoms. In rare cases, if your symptoms are caused by certain conditions that affect the inner ear, you may need surgery. °HOME CARE INSTRUCTIONS  °· Follow your caregiver's instructions. °· Move slowly. Do not make sudden body or head movements. °· Avoid driving. °· Avoid operating heavy machinery. °· Avoid performing any tasks that would be dangerous to you or others during a vertigo episode. °· Drink enough fluids to keep your urine clear or pale yellow. °SEEK IMMEDIATE MEDICAL CARE IF:  °· You develop problems with walking, weakness, numbness, or using your arms, hands, or legs. °· You have difficulty speaking. °· You develop  severe headaches. °· Your nausea or vomiting continues or gets worse. °· You develop visual changes. °· Your family or friends notice any behavioral changes. °· Your condition gets worse. °· You have a fever. °· You develop a stiff neck or sensitivity to light. °MAKE SURE YOU:  °· Understand these instructions. °· Will watch your condition. °· Will get help right away if you are not doing well or get worse. °Document Released: 03/02/2006 Document Revised: 08/17/2011 Document Reviewed: 02/12/2011 °[employer]® Patient Information ©2015 [employer]. This information is not intended to replace advice given to you by your health care provider. Make sure you discuss any questions you have with your health care provider. ° °Chest Pain (Nonspecific) °It is often hard to give a specific diagnosis for the cause of chest pain. There is always a chance that your pain could be related to something serious, such as a heart attack or a blood clot in the lungs. You need to follow up with your health care provider for further evaluation. °CAUSES  °· Heartburn. °· Pneumonia or bronchitis. °· Anxiety or stress. °· Inflammation around your heart (pericarditis) or lung (pleuritis or pleurisy). °· A blood clot in the lung. °· A collapsed lung (pneumothorax). It can develop suddenly on its own (spontaneous pneumothorax) or from trauma to the chest. °· Shingles infection (herpes zoster virus). °The chest wall is composed of bones, muscles, and cartilage. Any of these can be the source of the pain. °· The bones can be bruised by injury. °· The muscles or cartilage can be strained by coughing or overwork. °· The cartilage can be affected by inflammation and become sore (costochondritis). °DIAGNOSIS  °  Lab tests or other studies may be needed to find the cause of your pain. Your health care provider may have you take a test called an ambulatory electrocardiogram (ECG). An ECG records your heartbeat patterns over a 24-hour period. You may also  have other tests, such as: °· Transthoracic echocardiogram (TTE). During echocardiography, sound waves are used to evaluate how blood flows through your heart. °· Transesophageal echocardiogram (TEE). °· Cardiac monitoring. This allows your health care provider to monitor your heart rate and rhythm in real time. °· Holter monitor. This is a portable device that records your heartbeat and can help diagnose heart arrhythmias. It allows your health care provider to track your heart activity for several days, if needed. °· Stress tests by exercise or by giving medicine that makes the heart beat faster. °TREATMENT  °· Treatment depends on what may be causing your chest pain. Treatment may include: °¨ Acid blockers for heartburn. °¨ Anti-inflammatory medicine. °¨ Pain medicine for inflammatory conditions. °¨ Antibiotics if an infection is present. °· You may be advised to change lifestyle habits. This includes stopping smoking and avoiding alcohol, caffeine, and chocolate. °· You may be advised to keep your head raised (elevated) when sleeping. This reduces the chance of acid going backward from your stomach into your esophagus. °Most of the time, nonspecific chest pain will improve within 2-3 days with rest and mild pain medicine.  °HOME CARE INSTRUCTIONS  °· If antibiotics were prescribed, take them as directed. Finish them even if you start to feel better. °· For the next few days, avoid physical activities that bring on chest pain. Continue physical activities as directed. °· Do not use any tobacco products, including cigarettes, chewing tobacco, or electronic cigarettes. °· Avoid drinking alcohol. °· Only take medicine as directed by your health care provider. °· Follow your health care provider's suggestions for further testing if your chest pain does not go away. °· Keep any follow-up appointments you made. If you do not go to an appointment, you could develop lasting (chronic) problems with pain. If there is any  problem keeping an appointment, call to reschedule. °SEEK MEDICAL CARE IF:  °· Your chest pain does not go away, even after treatment. °· You have a rash with blisters on your chest. °· You have a fever. °SEEK IMMEDIATE MEDICAL CARE IF:  °· You have increased chest pain or pain that spreads to your arm, neck, jaw, back, or abdomen. °· You have shortness of breath. °· You have an increasing cough, or you cough up blood. °· You have severe back or abdominal pain. °· You feel nauseous or vomit. °· You have severe weakness. °· You faint. °· You have chills. °This is an emergency. Do not wait to see if the pain will go away. Get medical help at once. Call your local emergency services (911 in [place] Do not drive yourself to the hospital. °MAKE SURE YOU:  °· Understand these instructions. °· Will watch your condition. °· Will get help right away if you are not doing well or get worse. °Document Released: 03/04/2005 Document Revised: 05/30/2013 Document Reviewed: 12/29/2007 °ExitCare® Patient Information ©2015 [employer]. This information is not intended to replace advice given to you by your health care provider. Make sure you discuss any questions you have with your health care provider. ° °

## 2014-12-12 NOTE — Progress Notes (Signed)
Chief Complaint:  Chief Complaint  Patient presents with  . Chest Pain    started sunday night   . Headache    started weeks ago   . Dizziness  . Nausea  . Fatigue    HPI: Kellie Santiago is a 54 y.o. female who is here for a constellation of problems. 1. Chest pain that feels like pressure, nonradiating and is centrally located in her midepigastric area. She has no history of reflux that she is aware of. She started feeling the chest pain on Sunday. She stated she had watermelon. She burped and was hoping that it would go away and it eventually did after several minutes. She denies any numbness weakness tingling. There is no jaw pain. There is no shoulder pain. He has no abdominal pain or diaphoresis. She has some nausea but that is new and related to the dizziness that she started feeling today. She has no slurred speech, vision changes, shortness of breath, palpitations. Again she at this on Sunday. And it was not related to exertion. Did not feel it until today after she started getting dizzy. She denies any history of hyperlipidemia, hypertension, premature MI for her immediate family, diabetes, thyroid disease. She has not tried anything for this.  2. She has a history of frontal headaches. She has a history of concussion. This feels like her normal frontal headaches. There has been a lot of stress in the family., A lot of illnesses and deaths. Her father is sick while she was on vacation and is not recovering well. She has a 102 year old grandmother who is living with her but does not want to be with her. She recently had to go to a meeting this morning and had worked out a different care plan for her she is not gainfully employed at this time. 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  3. She was driving and felt dizzy, she pulled over.  She then started having the chest pressure  after this. When she moves her head she feels dizzy. If she moves her eyes she doesn't have any problems. She denies any history of sinusitis. She does carry allergies with her all the time. She doesn't take any regular medications. Unless it's very bad she takes her Xyzal. She denies any history of vertigo, any recent URI symptoms. She does not have any ear pain. She does not have any facial pain. She has some tension and frontal lobe tightness in her head. Again she does have a history of a concussion. It has been very stressful at home.  Denies numbness, weakness tingling. No vision changes today. But 2 months ago she had an episode where she did have decreased vision but then it went away in a couple seconds.  Headache is frontal tightness which is normal for her sicne she has had the conscussion. She feels like her brain is really tight.   Past Medical History  Diagnosis Date  . Colon polyps 06/08/2010    colonoscopy:  +5 polyps resected.  [facility].  . Allergy     allergy shots; followed by [facility].   Past Surgical History  Procedure Laterality Date  . Cesarean section      X 2  . Mandible surgery    . Colonoscopy w/ polypectomy  04/24/2011    5 polyps resected (Tubular adenoma, hyperplastic polyp).  [facility].  Repeat in 5 years.   History   Social History  . Marital Status: Significant Other    Spouse Name: Freddi Starr  . Number of Children: 2  . Years of Education: MBA   Occupational History  . Account Haematologist   Social History Main Topics  . Smoking status: Former Smoker    Types: Cigars  . Smokeless tobacco: Not on file     Comment: smokes cigars only on holidays, with her dad  . Alcohol Use: 1.5 oz/week    3 drink(s) per week     Comment: SOCIALLY  . Drug Use: No  . Sexual Activity:    Partners: Male    Birth Control/ Protection: Pill   Other Topics Concern  . None   Social History Narrative   Marital status:  Divorced; dating x 15 years.  Happy.   No abuse.      Children: 2 (23, 28); 1 adopted grandchild (2).      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      Tobacco: none; rare cigar.      Alcohol: red wine weekends and some during the week.      Drugs:  None      Exercise:  Walk/run couch to 5K.      Seatbelt: 100%      Guns:  None      Sexual activity: total partners = 6; no STDs.  Last HIV testing 2015.          Family History  Problem Relation Age of Onset  . Hypertension Mother   . Hyperlipidemia Mother   . Diabetes Father   . Dementia Father     Alzheimer's dementia  . Alzheimer's disease Father   . Hypertension Brother   . Hypertension Maternal Grandmother   . Diabetes Maternal Grandmother   . Alzheimer's disease Paternal Grandfather    Allergies  Allergen Reactions  . Penicillins Rash   Prior to Admission medications   Medication Sig Start Date End Date Taking? Authorizing Provider  levocetirizine (XYZAL) 5 MG tablet Take 5 mg by mouth every evening.     Yes Historical Provider, MD  Multiple Vitamins-Minerals (MULTIVITAMIN PO) Take 1 tablet by mouth daily.   Yes Historical Provider, MD  norethindrone-ethinyl estradiol (ORTHO-NOVUM 7/7/7, 28,) 0.5/0.75/1-35 MG-MCG tablet Take 1 tablet by mouth daily. PATIENT NEEDS OFFICE VISIT FOR ADDITIONAL REFILLS 02/07/14  Yes Ethelda Chick, MD  triamcinolone cream (KENALOG) 0.1 % Apply 1 application topically 2 (two) times daily. 08/21/13  Yes Carmelina Dane, MD  fluticasone (FLONASE) 50 MCG/ACT nasal spray Place 2 sprays into the nose daily.      Historical Provider, MD     ROS: The patient denies fevers, chills, night sweats, unintentional weight loss,  palpitations, wheezing, dyspnea on exertion, vomiting, abdominal pain, dysuria, hematuria, melena, numbness, or tingling.   All other systems have been reviewed and were otherwise negative with the exception of those mentioned in the HPI and as above.    PHYSICAL EXAM: Filed  Vitals:   12/12/14 1419  BP: 126/78  Pulse: 93  Temp: 98.1 F (36.7 C)  Resp: 17   Filed Vitals:   12/12/14 1419  Height: 5\' 3"  (1.6 m)  Weight: 231 lb 6.4 oz (104.962 kg)   Body mass index is 41 kg/(m^2).  SpO2 Readings from Last 3 Encounters:  12/12/14 98%  02/07/14 98%  09/12/13 98%     General: Alert, no acute distress HEENT:  Normocephalic, atraumatic, oropharynx patent. EOMI, PERRLA TM normal, no thyroid megaly, fundoscopic exam grossly normal. Cardiovascular:  Regular rate and rhythm, no rubs murmurs or gallops.  No Carotid bruits, radial pulse intact. No pedal edema.  Respiratory: Clear to auscultation bilaterally.  No wheezes, rales, or rhonchi.  No cyanosis, no use of accessory musculature GI: No organomegaly, abdomen is soft and non-tender, positive bowel sounds.  No masses. Skin: No rashes. Neurologic: Facial musculature symmetric. CN 2-12 grossly normal Psychiatric: Patient is appropriate throughout our interaction. Lymphatic: No cervical lymphadenopathy Musculoskeletal: Gait intact.   LABS: Results for orders placed or performed in visit on 12/12/14  Troponin I  Result Value Ref Range   Troponin I <0.01 <0.06 ng/mL  POCT CBC  Result Value Ref Range   WBC 10.5 (A) 4.6 - 10.2 K/uL   Lymph, poc 2.6 0.6 - 3.4   POC LYMPH PERCENT 24.7 10 - 50 %L   MID (cbc) 0.2 0 - 0.9   POC MID % 2.1 0 - 12 %M   POC Granulocyte 7.7 (A) 2 - 6.9   Granulocyte percent 73.2 37 - 80 %G   RBC 4.94 4.04 - 5.48 M/uL   Hemoglobin 12.5 12.2 - 16.2 g/dL   HCT, POC 21.3 08.6 - 47.9 %   MCV 79.5 (A) 80 - 97 fL   MCH, POC 25.3 (A) 27 - 31.2 pg   MCHC 31.8 31.8 - 35.4 g/dL   RDW, POC 57.8 %   Platelet Count, POC 260 142 - 424 K/uL   MPV 7.9 0 - 99.8 fL  POCT UA - Microscopic Only  Result Value Ref Range   WBC, Ur, HPF, POC 1-3    RBC, urine, microscopic 0-1    Bacteria, U Microscopic many    Mucus, UA neg    Epithelial cells, urine per micros 0-5    Crystals, Ur, HPF, POC  neg    Casts, Ur, LPF, POC neg    Yeast, UA neg   POCT urinalysis dipstick  Result Value Ref Range   Color, UA yellow    Clarity, UA clear    Glucose, UA neg    Bilirubin, UA neg    Ketones, UA 15    Spec Grav, UA >=1.030    Blood, UA neg    pH, UA 5.5    Protein, UA neg    Urobilinogen, UA 0.2    Nitrite, UA neg    Leukocytes, UA Negative Negative  POCT glucose (manual entry)  Result Value Ref Range   POC Glucose 92 70 - 99 mg/dl     EKG/XRAY:   Primary read interpreted by Dr. Conley Rolls at [facility]. No acute cardiopulmonary process EKG showed normal sinus rhythm with nonspecific ST changes   ASSESSMENT/PLAN: Encounter Diagnoses  Name Primary?  . Other chest pain Yes  . Dizziness and giddiness   . Benign paroxysmal positional vertigo, unspecified laterality   . Other headache syndrome   . Nausea without vomiting   . Dehydration    54 year old African-American female with a PMH of frontal headaches status post concussion several years back who presented some atypical mid epigastric, localized chest pressure, headache in the frontal area, dizziness consistent with vertigo. She does not have any hypertension, diabetes, hyperlipidemia, premature family history of heart attacks. Her EKG, chest x-ray, in-house CBC, glucose, urine and physical exam were all reassuring. Advise to push fluids due to  possible dehydration with worsening headache. This may improve her overall fatigue and headache symptoms. Stat troponin I  Other labs: CMP, TSH I will hold off on giving her a GI cocktail. This will be considered once she gets her cardiac enzyme. She was given a prescription for meclizine 25 mg by mouth 3 times a day when necessary for vertigo, if this does not help then she may need Valium 2 mg by mouth every 8 hours when necessary. The valium may help with all the stress that is going on in her current life. It will also give her some needed sleep to help with her vertigo and  headaches. Again  she needs to push fluids. I have given her the option of going to the ER however she declined. Risk and benefits were given to her. Follow-up as needed, she will go to ER prn , precautions given   Gross sideeffects, risk and benefits, and alternatives of medications d/w patient. Patient is aware that all medications have potential sideeffects and we are unable to predict every sideeffect or drug-drug interaction that may occur.  Rockne CoonsLE, Cantrell Larouche PHUONG, DO 12/12/2014 6:14 PM   I spoke with patient about her labs, stat troponin was negative.

## 2014-12-21 ENCOUNTER — Telehealth: Payer: Self-pay | Admitting: Family Medicine

## 2014-12-21 NOTE — Telephone Encounter (Signed)
LM about labs, checking in to see if she is feeling better

## 2015-01-05 ENCOUNTER — Telehealth: Payer: Self-pay | Admitting: Family Medicine

## 2015-01-05 NOTE — Telephone Encounter (Signed)
lmom that her new appt with dr Katrinka Blazing is 02-15-15 at 8:15 CPE

## 2015-02-11 ENCOUNTER — Other Ambulatory Visit: Payer: Self-pay | Admitting: Family Medicine

## 2015-02-13 ENCOUNTER — Encounter: Payer: 59 | Admitting: Family Medicine

## 2015-02-15 ENCOUNTER — Encounter: Payer: Self-pay | Admitting: Family Medicine

## 2015-02-15 ENCOUNTER — Ambulatory Visit (INDEPENDENT_AMBULATORY_CARE_PROVIDER_SITE_OTHER): Payer: 59 | Admitting: Family Medicine

## 2015-02-15 VITALS — BP 122/78 | HR 82 | Temp 98.0°F | Resp 16 | Ht 63.5 in | Wt 228.8 lb

## 2015-02-15 DIAGNOSIS — Z13220 Encounter for screening for lipoid disorders: Secondary | ICD-10-CM | POA: Diagnosis not present

## 2015-02-15 DIAGNOSIS — E669 Obesity, unspecified: Secondary | ICD-10-CM

## 2015-02-15 DIAGNOSIS — J301 Allergic rhinitis due to pollen: Secondary | ICD-10-CM | POA: Diagnosis not present

## 2015-02-15 DIAGNOSIS — Z1231 Encounter for screening mammogram for malignant neoplasm of breast: Secondary | ICD-10-CM | POA: Diagnosis not present

## 2015-02-15 DIAGNOSIS — Z23 Encounter for immunization: Secondary | ICD-10-CM

## 2015-02-15 DIAGNOSIS — Z131 Encounter for screening for diabetes mellitus: Secondary | ICD-10-CM | POA: Diagnosis not present

## 2015-02-15 DIAGNOSIS — Z0000 Encounter for general adult medical examination without abnormal findings: Secondary | ICD-10-CM

## 2015-02-15 DIAGNOSIS — Z1329 Encounter for screening for other suspected endocrine disorder: Secondary | ICD-10-CM

## 2015-02-15 DIAGNOSIS — Z6839 Body mass index (BMI) 39.0-39.9, adult: Secondary | ICD-10-CM

## 2015-02-15 DIAGNOSIS — E559 Vitamin D deficiency, unspecified: Secondary | ICD-10-CM | POA: Diagnosis not present

## 2015-02-15 LAB — LIPID PANEL
CHOL/HDL RATIO: 3 ratio (ref <=5.0)
CHOLESTEROL: 171 mg/dL (ref 125–200)
HDL: 57 mg/dL (ref >=46)
LDL Cholesterol: 94 mg/dL (ref <130)
Triglycerides: 101 mg/dL (ref <150)
VLDL: 20 mg/dL (ref <30)

## 2015-02-15 LAB — CBC WITH DIFFERENTIAL/PLATELET
Basophils Absolute: 0.1 10*3/uL (ref 0.0–0.1)
Basophils Relative: 1 % (ref 0–1)
EOS PCT: 2 % (ref 0–5)
Eosinophils Absolute: 0.1 10*3/uL (ref 0.0–0.7)
HEMATOCRIT: 39.6 % (ref 36.0–46.0)
Hemoglobin: 13.1 g/dL (ref 12.0–15.0)
Lymphocytes Relative: 29 % (ref 12–46)
Lymphs Abs: 1.7 10*3/uL (ref 0.7–4.0)
MCH: 27.2 pg (ref 26.0–34.0)
MCHC: 33.1 g/dL (ref 30.0–36.0)
MCV: 82.2 fL (ref 78.0–100.0)
MPV: 10.4 fL (ref 8.6–12.4)
Monocytes Absolute: 0.4 10*3/uL (ref 0.1–1.0)
Monocytes Relative: 7 % (ref 3–12)
Neutro Abs: 3.6 10*3/uL (ref 1.7–7.7)
Neutrophils Relative %: 61 % (ref 43–77)
Platelets: 283 10*3/uL (ref 150–400)
RBC: 4.82 MIL/uL (ref 3.87–5.11)
RDW: 14.7 % (ref 11.5–15.5)
WBC: 5.9 10*3/uL (ref 4.0–10.5)

## 2015-02-15 LAB — COMPREHENSIVE METABOLIC PANEL
ALBUMIN: 3.8 g/dL (ref 3.6–5.1)
ALK PHOS: 46 U/L (ref 33–130)
ALT: 18 U/L (ref 6–29)
AST: 21 U/L (ref 10–35)
BILIRUBIN TOTAL: 0.4 mg/dL (ref 0.2–1.2)
BUN: 16 mg/dL (ref 7–25)
CALCIUM: 9 mg/dL (ref 8.6–10.4)
CO2: 24 mmol/L (ref 20–31)
CREATININE: 0.91 mg/dL (ref 0.50–1.05)
Chloride: 106 mmol/L (ref 98–110)
Glucose, Bld: 97 mg/dL (ref 65–99)
Potassium: 4.2 mmol/L (ref 3.5–5.3)
SODIUM: 140 mmol/L (ref 135–146)
TOTAL PROTEIN: 6.8 g/dL (ref 6.1–8.1)

## 2015-02-15 LAB — POCT URINALYSIS DIPSTICK
Bilirubin, UA: NEGATIVE
Glucose, UA: NEGATIVE
Ketones, UA: NEGATIVE
Leukocytes, UA: NEGATIVE
NITRITE UA: NEGATIVE
Protein, UA: NEGATIVE
Spec Grav, UA: >=1.03
Urobilinogen, UA: 0.2
pH, UA: 5

## 2015-02-15 LAB — TSH: TSH: 2.065 u[IU]/mL (ref 0.350–4.500)

## 2015-02-15 LAB — HEMOGLOBIN A1C
HEMOGLOBIN A1C: 5.2 % (ref <5.7)
MEAN PLASMA GLUCOSE: 103 mg/dL (ref <117)

## 2015-02-15 NOTE — Patient Instructions (Signed)

## 2015-02-15 NOTE — Progress Notes (Signed)
Subjective:    Patient ID: Kellie Santiago, female    DOB: 1961-03-16, 54 y.o.   MRN: [id number]  HPI This 54 y.o. female presents for Complete Physical Examination.  Last physical:  02-07-2014 Pap smear:  02-07-2014  WNL HPV negative; GC/Chlam negative; regular menses on OCPs. Mammogram:  05-01-2013 Colonoscopy:  04-24-2011; repeat five years.   TDAP:  2013 Influenza:  02-15-2015 Eye exam:  2014; glasses; no glaucoma or cataracts. Dental exam:  Scheduled this month.  Terminated from job in October 2015; had plans to leave that job in 2016; now has been an adjunct professor; now looking into PhD programs; working on moving so getting house ready to sell.  Spending a lot of time with 101 aunt. Oldest son has moved in temporary.  Youngest son got married last week.  Heavy menses: onset six months ago; regardless of OCP; really heavy and lots of cramping.  Longer menses.  Heavier.  Duration 8 days now; no intermenstrual bleeding.  Went off the pill for three months two years ago.       Review of Systems  Constitutional: Negative.  Negative for fever, chills, diaphoresis, activity change, appetite change, fatigue and unexpected weight change.  HENT: Positive for sinus pressure. Negative for congestion, dental problem, drooling, ear discharge, ear pain, facial swelling, hearing loss, mouth sores, nosebleeds, postnasal drip, rhinorrhea, sneezing, sore throat, tinnitus, trouble swallowing and voice change.   Eyes: Negative.  Negative for photophobia, pain, discharge, redness, itching and visual disturbance.  Respiratory: Negative.  Negative for apnea, cough, choking, chest tightness, shortness of breath, wheezing and stridor.   Cardiovascular: Negative.  Negative for chest pain, palpitations and leg swelling.  Gastrointestinal: Negative.  Negative for nausea, vomiting, abdominal pain, diarrhea, constipation, blood in stool, abdominal distention, anal bleeding and rectal pain.  Endocrine: Negative.   Negative for cold intolerance, heat intolerance, polydipsia, polyphagia and polyuria.  Genitourinary: Positive for menstrual problem. Negative for dysuria, urgency, frequency, hematuria, flank pain, decreased urine volume, vaginal bleeding, vaginal discharge, enuresis, difficulty urinating, genital sores, vaginal pain, pelvic pain and dyspareunia.  Musculoskeletal: Negative.  Negative for myalgias, back pain, joint swelling, arthralgias, gait problem, neck pain and neck stiffness.  Skin: Negative.  Negative for color change, pallor, rash and wound.  Allergic/Immunologic: Negative.  Negative for environmental allergies, food allergies and immunocompromised state.  Neurological: Negative.  Negative for dizziness, tremors, seizures, syncope, facial asymmetry, speech difficulty, weakness, light-headedness, numbness and headaches.  Hematological: Negative.  Negative for adenopathy. Does not bruise/bleed easily.  Psychiatric/Behavioral: Negative.  Negative for suicidal ideas, hallucinations, behavioral problems, confusion, sleep disturbance, self-injury, dysphoric mood, decreased concentration and agitation. The patient is not nervous/anxious and is not hyperactive.    Past Medical History  Diagnosis Date  . Colon polyps 06/08/2010    colonoscopy:  +5 polyps resected.  [facility].  . Allergy     allergy shots; followed by [facility].  Zyrtec.   Past Surgical History  Procedure Laterality Date  . Cesarean section      X 2  . Mandible surgery    . Colonoscopy w/ polypectomy  04/24/2011    5 polyps resected (Tubular adenoma, hyperplastic polyp).  Mann.  Repeat in 5 years.   Allergies  Allergen Reactions  . Penicillins Rash   Social History   Social History  . Marital Status: Significant Other    Spouse Name: Freddi Starr  . Number of Children: 2  . Years of Education: MBA   Occupational History  . Account Manager  [employer]   Social History Main Topics  . Smoking status: Former Smoker      Types: Cigars  . Smokeless tobacco: Not on file     Comment: smokes cigars only on holidays, with her dad  . Alcohol Use: 1.5 oz/week    3 Standard drinks or equivalent per week     Comment: SOCIALLY  . Drug Use: No  . Sexual Activity:    Partners: Male    Birth Control/ Protection: Pill   Other Topics Concern  . Not on file   Social History Narrative   Marital status:  Divorced; dating x 16 years.  Happy.  No abuse.      Children: 2 (24, 29); 1 adopted grandchild (3).      Lives:  Boyfriend, son temporarily      Employment:  [employer] Adjunct Professor Chief Strategy Officer; MBA in Chief Financial Officer.  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      Tobacco: none; rare cigar.        Alcohol: red wine weekends and some during the week.  1 bottle per week.      Drugs:  None      Exercise:  Sporadic.      Seatbelt: 100%      Guns:  None      Sexual activity: total partners = 6; no STDs.  Last HIV testing 2015.          Family History  Problem Relation Age of Onset  . Hypertension Mother   . Hyperlipidemia Mother   . Diabetes Father   . Dementia Father     Alzheimer's dementia  . Alzheimer's disease Father   . Hypertension Father   . Hyperlipidemia Father   . Hypertension Brother   . Hypertension Maternal Grandmother   . Diabetes Maternal Grandmother   . Alzheimer's disease Paternal Grandfather        Objective:   Physical Exam  Constitutional: She is oriented to person, place, and time. She appears well-developed and well-nourished. No distress.  obese  HENT:  Head: Normocephalic and atraumatic.  Right Ear: External ear normal.  Left Ear: External ear normal.  Nose: Nose normal.  Mouth/Throat: Oropharynx is clear and moist.  Eyes: Conjunctivae and EOM are normal. Pupils are equal, round, and reactive to light.  Neck: Normal range of motion and full passive range of motion without pain. Neck supple. No JVD present. Carotid bruit is not  present. No thyromegaly present.  Cardiovascular: Normal rate, regular rhythm and normal heart sounds.  Exam reveals no gallop and no friction rub.   No murmur heard. Pulmonary/Chest: Effort normal and breath sounds normal. She has no wheezes. She has no rales. Right breast exhibits no inverted nipple, no mass, no nipple discharge, no skin change and no tenderness. Left breast exhibits no inverted nipple, no mass, no nipple discharge, no skin change and no tenderness. Breasts are symmetrical.  Abdominal: Soft. Bowel sounds are normal. She exhibits no distension and no mass. There is no tenderness. There is no rebound and no guarding.  Musculoskeletal:       Right shoulder: Normal.       Left shoulder: Normal.       Cervical back: Normal.  Lymphadenopathy:    She has no cervical adenopathy.  Neurological: She is alert and oriented to person, place, and time. She has normal reflexes. No cranial nerve deficit. She exhibits normal muscle tone. Coordination normal.  Skin: Skin is  warm and dry. No rash noted. She is not diaphoretic. No erythema. No pallor.  Psychiatric: She has a normal mood and affect. Her behavior is normal. Judgment and thought content normal.  Nursing note and vitals reviewed.  INFLUENZA VACCINE ADMINISTERED.    Assessment & Plan:   1. Routine physical examination   2. Need for prophylactic vaccination and inoculation against influenza   3. Vitamin D deficiency   4. Screening for diabetes mellitus   5. Screening, lipid   6. Screening for thyroid disorder   7. Allergic rhinitis due to pollen   8. Obesity   9. BMI 39.0-39.9,adult   10. Encounter for screening mammogram for breast cancer     1. Complete Physical Examination: anticipatory guidance --- exercise, weight loss, 3 servings of calcium daily. Pap smear UTD. Refer for mammogram.  Colonoscopy UTD. S/p flu vaccine.  2.  S/p flu vaccine. 3.  Vitamin D deficiency: stable; obtain labs. 3.  Screening DMII: obtain  glucose, HgbA1c. 4.  Screening lipid: obtain FLP. 5.  Screening thyroid: obtain TSH. 6.  Allergic Rhinitis:  Stable; continue Flonase and OTC Claritin/Zyrtec/Allegra. 7.  Obesity/BMI 39: encourage ongoing weight loss with exercise, low-fat and low-caloric food choices. 8. Breast cancer screening: refer for mammogram. 9. Contraception management: now that age 62, can stop OCP; RTC three months to check FSH, LH levels.  Recent menorrhagia on OCP so suspect perimenopausal.  No intermenstrual bleeding at this time.   Orders Placed This Encounter  Procedures  . MM Digital Screening    Standing Status: Future     Number of Occurrences:      Standing Expiration Date: 04/16/2016    Order Specific Question:  Reason for Exam (SYMPTOM  OR DIAGNOSIS REQUIRED)    Answer:  annual screening    Order Specific Question:  Is the patient pregnant?    Answer:  No    Order Specific Question:  Preferred imaging location?    Answer:  [facility]  . Flu Vaccine QUAD 36+ mos IM  . CBC with Differential/Platelet  . Comprehensive metabolic panel    Order Specific Question:  Has the patient fasted?    Answer:  Yes  . Hemoglobin A1c  . Lipid panel    Order Specific Question:  Has the patient fasted?    Answer:  Yes  . TSH  . Vit D  25 hydroxy (rtn osteoporosis monitoring)  . POCT urinalysis dipstick   No orders of the defined types were placed in this encounter.   Kellie Santiago, M.D. [facility] [address] [place], [place]  [address] [phone] phone [phone] fax

## 2015-02-16 LAB — VITAMIN D 25 HYDROXY (VIT D DEFICIENCY, FRACTURES): VIT D 25 HYDROXY: 24 ng/mL — AB (ref 30–100)

## 2015-03-01 ENCOUNTER — Ambulatory Visit
Admission: RE | Admit: 2015-03-01 | Discharge: 2015-03-01 | Disposition: A | Discharge status: Home / Self Care | Payer: 59 | Source: Ambulatory Visit | Attending: Family Medicine | Admitting: Family Medicine

## 2015-03-01 DIAGNOSIS — Z1231 Encounter for screening mammogram for malignant neoplasm of breast: Secondary | ICD-10-CM

## 2015-06-14 ENCOUNTER — Ambulatory Visit: Payer: 59 | Admitting: Family Medicine

## 2016-06-19 ENCOUNTER — Other Ambulatory Visit: Payer: Self-pay | Admitting: Family Medicine

## 2016-06-19 DIAGNOSIS — Z1231 Encounter for screening mammogram for malignant neoplasm of breast: Secondary | ICD-10-CM

## 2016-06-22 IMAGING — CR DG CHEST 2V
2 series · 2 of 2 positions shown · non-contrast
Comparison: None.

CLINICAL DATA: Shortness of breath today.

EXAM:
CHEST  2 VIEW

[PA]
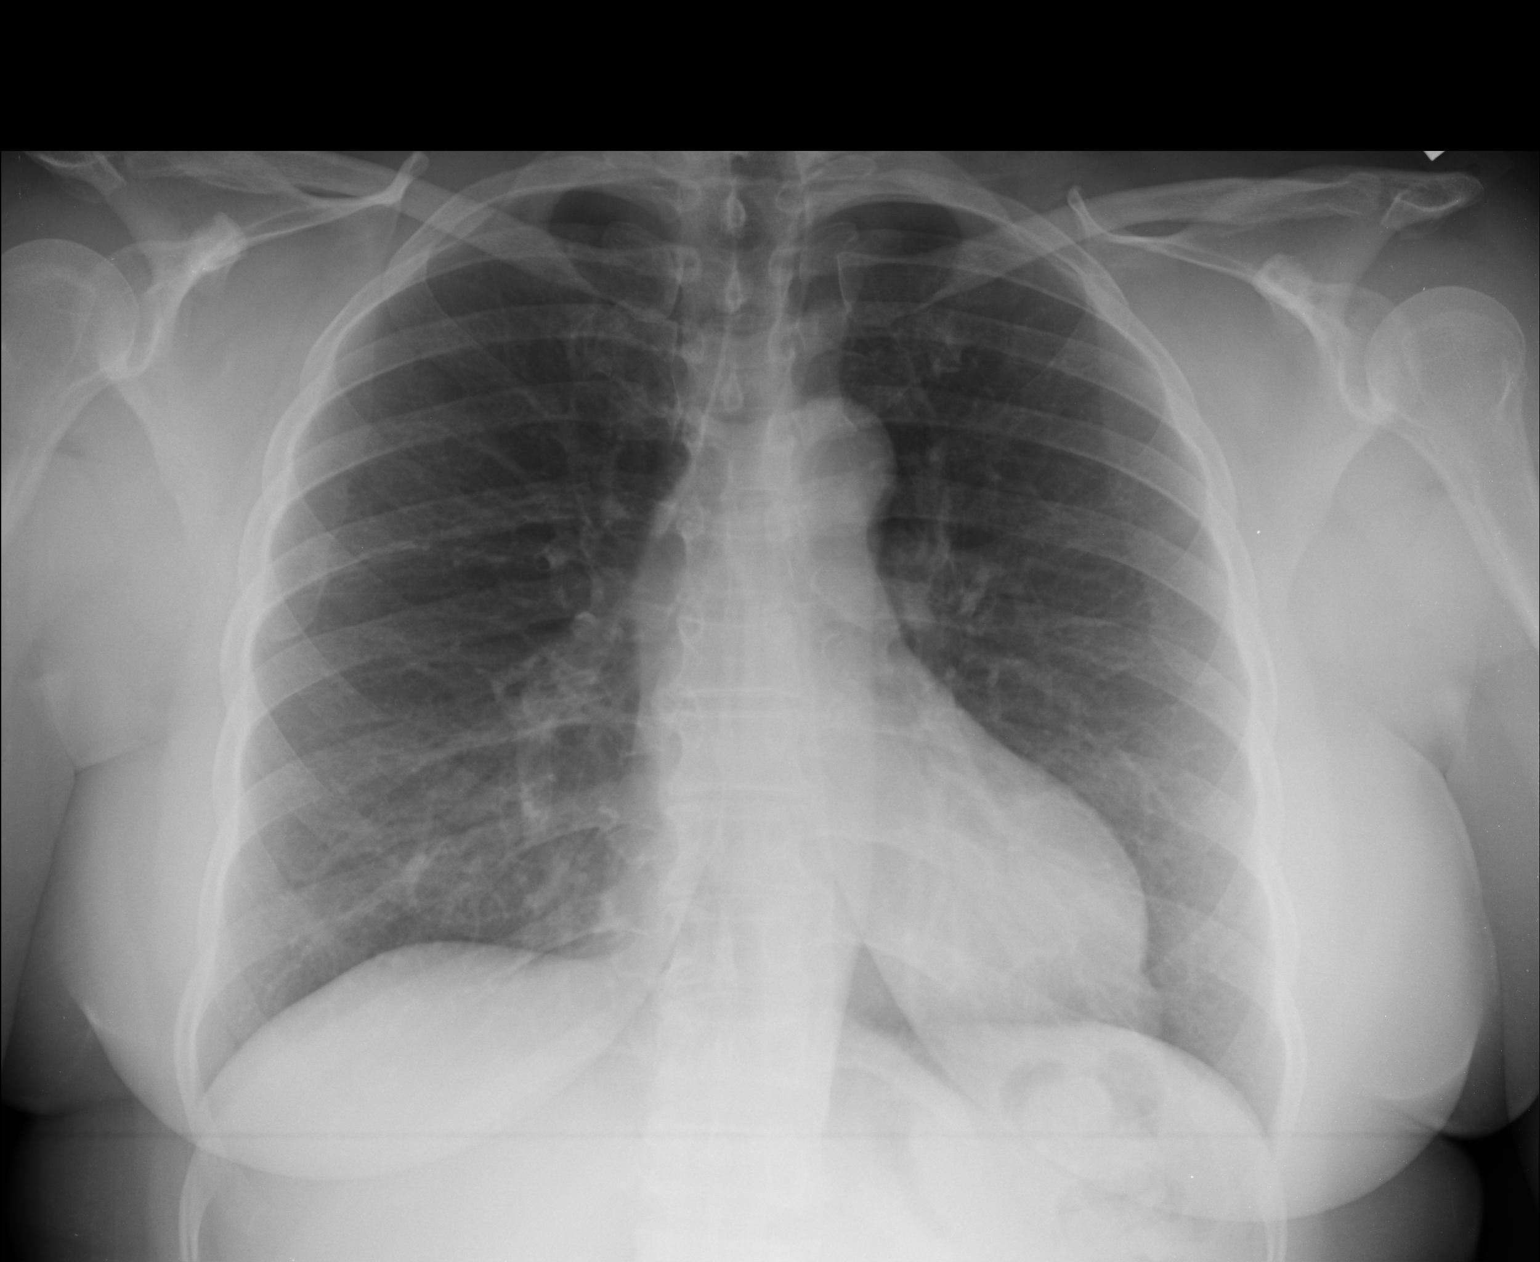

[lateral]
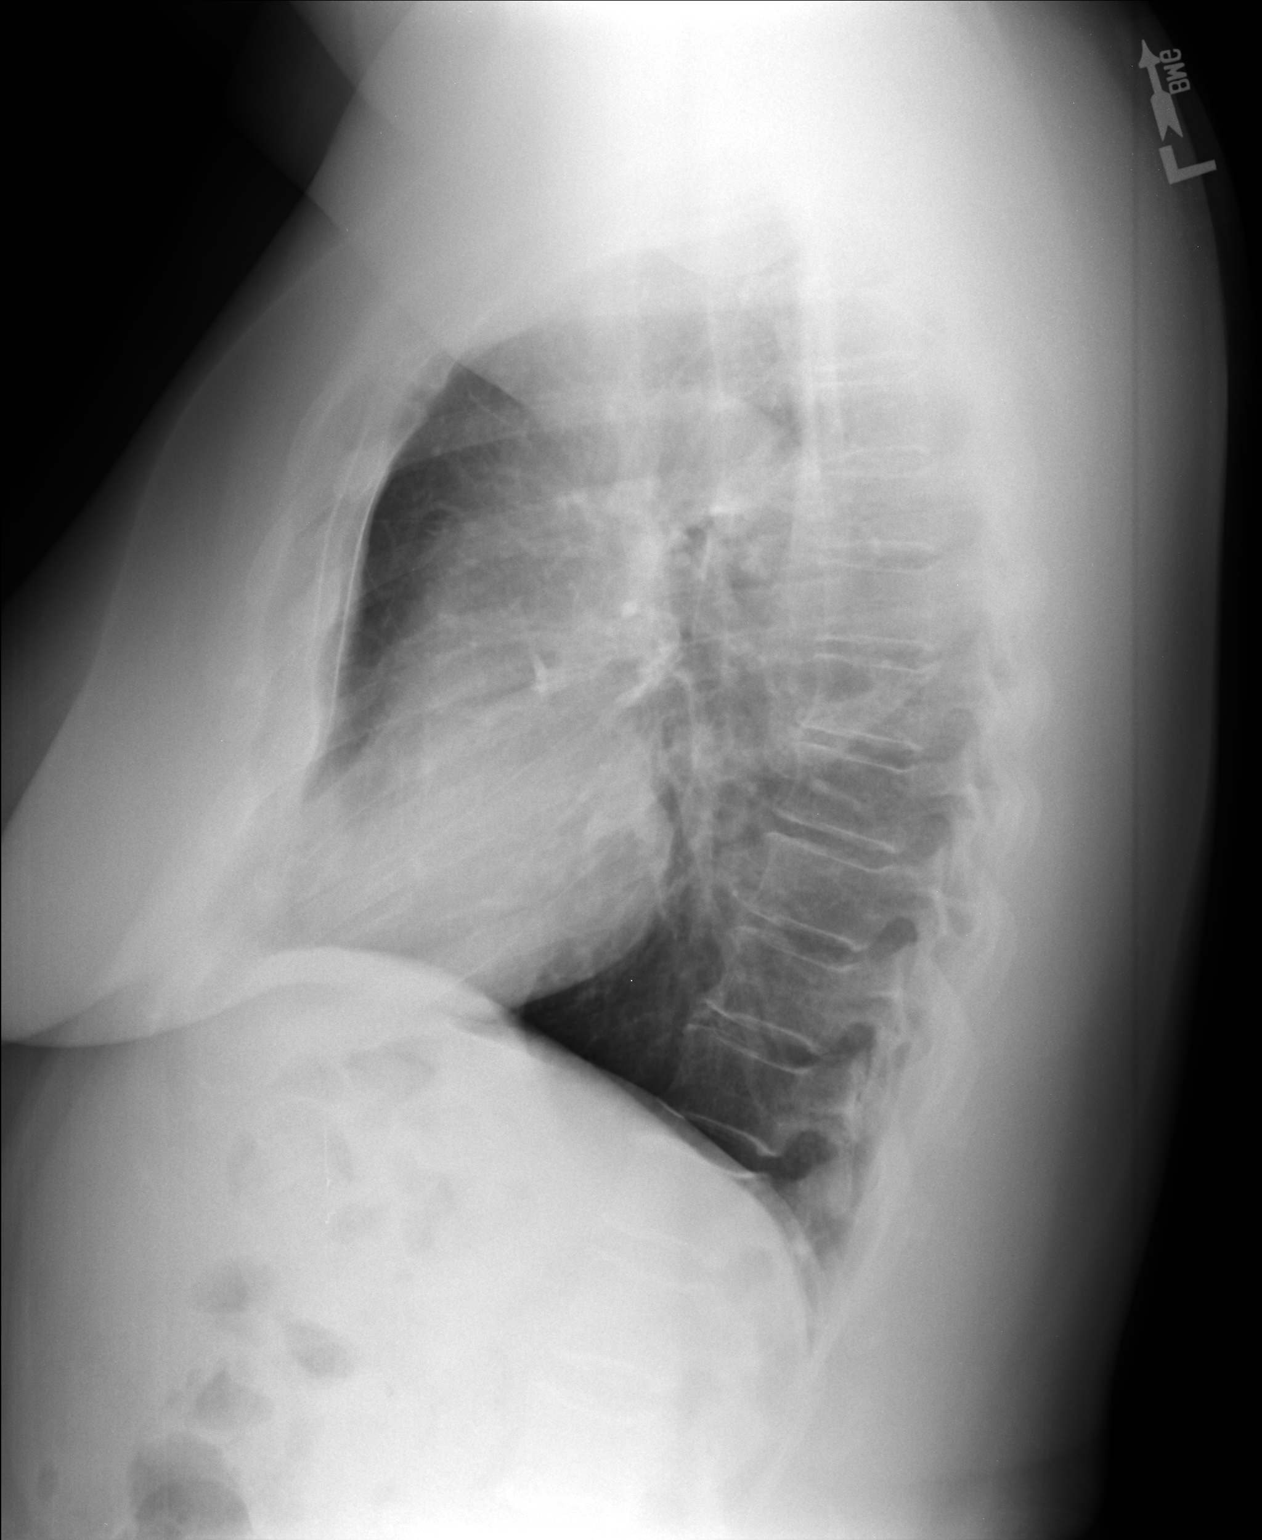

[2 of 2 positions shown; findings below may reference images not displayed]

FINDINGS: Heart size and mediastinal contours are within normal limits. Both
lungs are clear. Visualized skeletal structures are unremarkable.
IMPRESSION: Negative exam.

## 2016-07-20 ENCOUNTER — Encounter: Payer: Self-pay | Admitting: Radiology

## 2016-07-20 ENCOUNTER — Ambulatory Visit
Admission: RE | Admit: 2016-07-20 | Discharge: 2016-07-20 | Disposition: A | Discharge status: Home / Self Care | Payer: Commercial Managed Care - PPO | Source: Ambulatory Visit | Attending: Family Medicine | Admitting: Family Medicine

## 2016-07-20 DIAGNOSIS — Z1231 Encounter for screening mammogram for malignant neoplasm of breast: Secondary | ICD-10-CM

## 2017-09-30 ENCOUNTER — Other Ambulatory Visit: Payer: Self-pay | Admitting: Family Medicine

## 2017-09-30 DIAGNOSIS — Z1231 Encounter for screening mammogram for malignant neoplasm of breast: Secondary | ICD-10-CM

## 2017-10-21 ENCOUNTER — Ambulatory Visit
Admission: RE | Admit: 2017-10-21 | Discharge: 2017-10-21 | Disposition: A | Discharge status: Home / Self Care | Payer: Managed Care, Other (non HMO) | Source: Ambulatory Visit | Attending: Family Medicine | Admitting: Family Medicine

## 2017-10-21 DIAGNOSIS — Z1231 Encounter for screening mammogram for malignant neoplasm of breast: Secondary | ICD-10-CM

## 2019-05-03 ENCOUNTER — Other Ambulatory Visit: Payer: Self-pay | Admitting: Family Medicine

## 2019-05-03 DIAGNOSIS — Z1231 Encounter for screening mammogram for malignant neoplasm of breast: Secondary | ICD-10-CM
